# Patient Record
Sex: Female | Born: 1935 | Race: White | Hispanic: No | Smoking: Never smoker
Health system: Southern US, Community
[De-identification: ages and names within clinical notes are randomized; demographics above are authoritative.]

## PROBLEM LIST (undated history)

## (undated) DIAGNOSIS — F329 Major depressive disorder, single episode, unspecified: Secondary | ICD-10-CM

## (undated) DIAGNOSIS — F32A Depression, unspecified: Secondary | ICD-10-CM

## (undated) DIAGNOSIS — I4891 Unspecified atrial fibrillation: Secondary | ICD-10-CM

## (undated) DIAGNOSIS — F319 Bipolar disorder, unspecified: Secondary | ICD-10-CM

## (undated) DIAGNOSIS — I639 Cerebral infarction, unspecified: Secondary | ICD-10-CM

## (undated) DIAGNOSIS — I739 Peripheral vascular disease, unspecified: Secondary | ICD-10-CM

## (undated) DIAGNOSIS — K2270 Barrett's esophagus without dysplasia: Secondary | ICD-10-CM

## (undated) DIAGNOSIS — K219 Gastro-esophageal reflux disease without esophagitis: Secondary | ICD-10-CM

## (undated) DIAGNOSIS — I10 Essential (primary) hypertension: Secondary | ICD-10-CM

## (undated) DIAGNOSIS — F0390 Unspecified dementia without behavioral disturbance: Secondary | ICD-10-CM

## (undated) DIAGNOSIS — I509 Heart failure, unspecified: Secondary | ICD-10-CM

---

## 2004-08-04 ENCOUNTER — Emergency Department: Payer: Self-pay | Admitting: Emergency Medicine

## 2004-08-11 ENCOUNTER — Emergency Department: Payer: Self-pay | Admitting: Unknown Physician Specialty

## 2005-02-04 ENCOUNTER — Emergency Department: Payer: Self-pay | Admitting: Unknown Physician Specialty

## 2005-03-02 ENCOUNTER — Ambulatory Visit: Payer: Self-pay

## 2005-03-15 ENCOUNTER — Ambulatory Visit: Payer: Self-pay

## 2005-06-11 ENCOUNTER — Ambulatory Visit: Payer: Self-pay

## 2005-08-12 ENCOUNTER — Ambulatory Visit: Payer: Self-pay | Admitting: Family Medicine

## 2005-10-03 ENCOUNTER — Emergency Department: Payer: Self-pay | Admitting: Emergency Medicine

## 2005-11-16 ENCOUNTER — Ambulatory Visit: Payer: Self-pay | Admitting: Family Medicine

## 2005-11-18 ENCOUNTER — Ambulatory Visit: Payer: Self-pay | Admitting: General Surgery

## 2005-11-30 ENCOUNTER — Ambulatory Visit: Payer: Self-pay | Admitting: Family Medicine

## 2006-01-20 ENCOUNTER — Ambulatory Visit: Payer: Self-pay | Admitting: Family Medicine

## 2006-01-31 ENCOUNTER — Ambulatory Visit: Payer: Self-pay | Admitting: Family Medicine

## 2006-02-11 ENCOUNTER — Ambulatory Visit: Payer: Self-pay | Admitting: Family Medicine

## 2006-07-27 IMAGING — CR DG CHEST 2V
1 series · 2 of 2 positions shown · non-contrast
Comparison: none

REASON FOR EXAM: Cough,weight loss
COMMENTS:

[Series 4616: postero_anterior · 0.11mm/px · 2 of 2 slices shown]
[im 1/2]
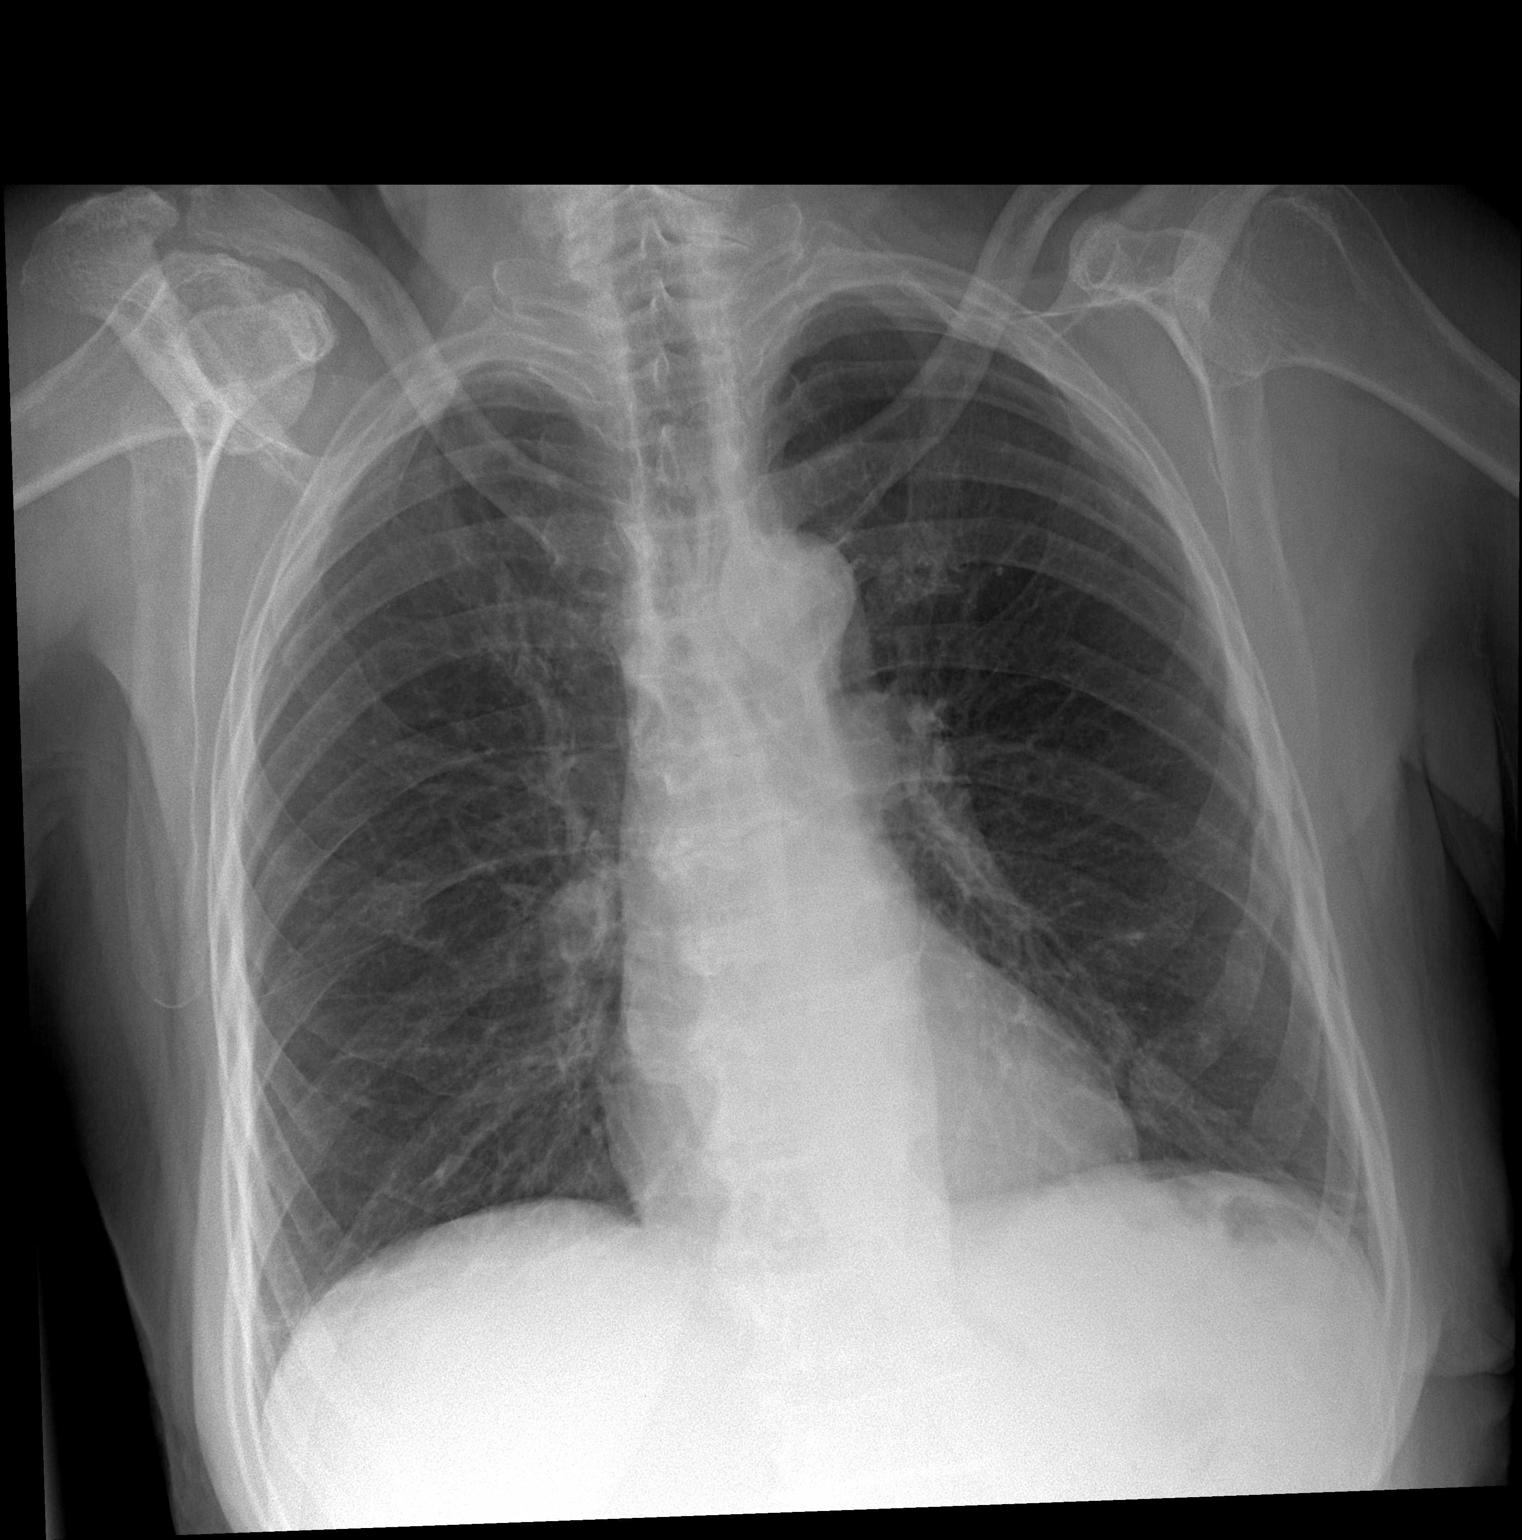
[im 2/2]
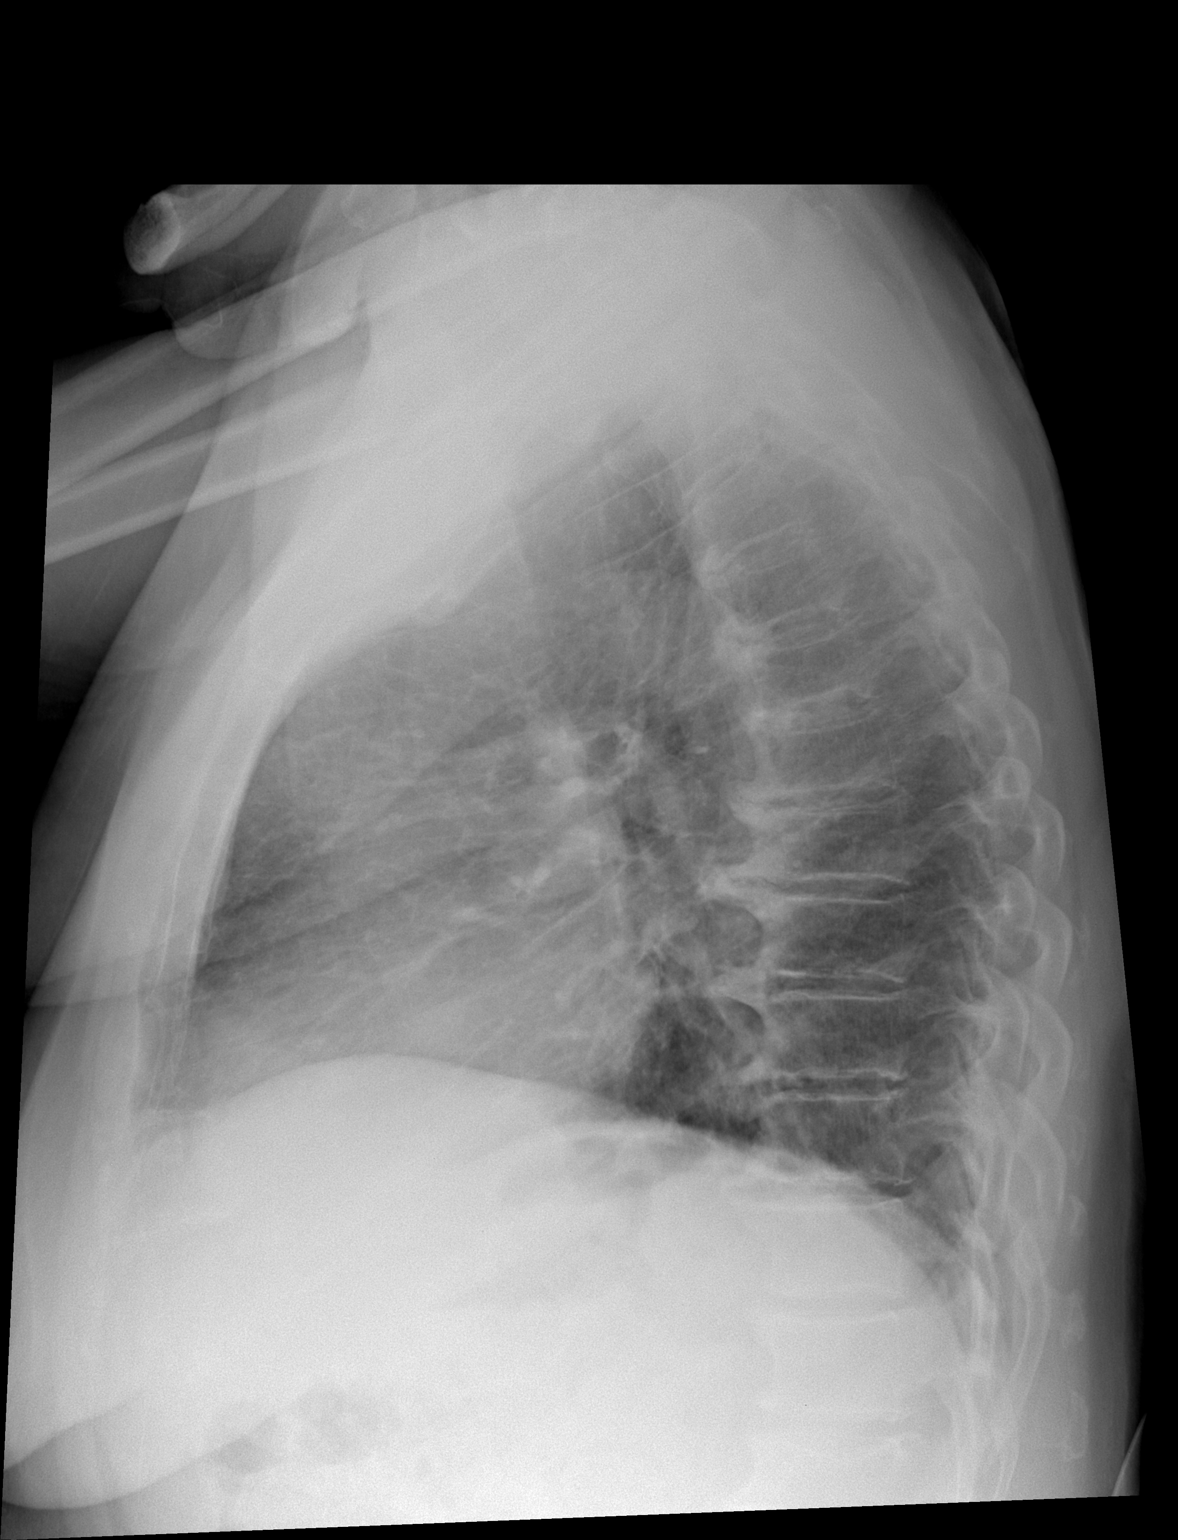

[2 of 2 positions shown; findings below may reference images not displayed]

PROCEDURE:     DXR - DXR CHEST PA (OR AP) AND LATERAL  - November 16, 2005  [DATE]

RESULT:        PA and lateral view were obtained and compared to prior study
of 02/05/05.  The heart is within normal limits in size.  The lung fields
appear clear. There is some linear subsegmental atelectasis in the lung
bases.  No effusions are noted.  Vascularity is within normal limits.

Degenerative spurs are noted in the thoracic spine as seen previously.
IMPRESSION: Subsegmental atelectasis in the lung bases.  The lung fields are otherwise
clear.

## 2006-10-22 IMAGING — CR DG CLAVICLE*R*
1 series · 4 of 4 positions shown · non-contrast
Comparison: none

REASON FOR EXAM: Fall
COMMENTS:

[Series 1: view not recorded · 0.17mm/px · 4 of 4 slices shown]
[im 1/4]
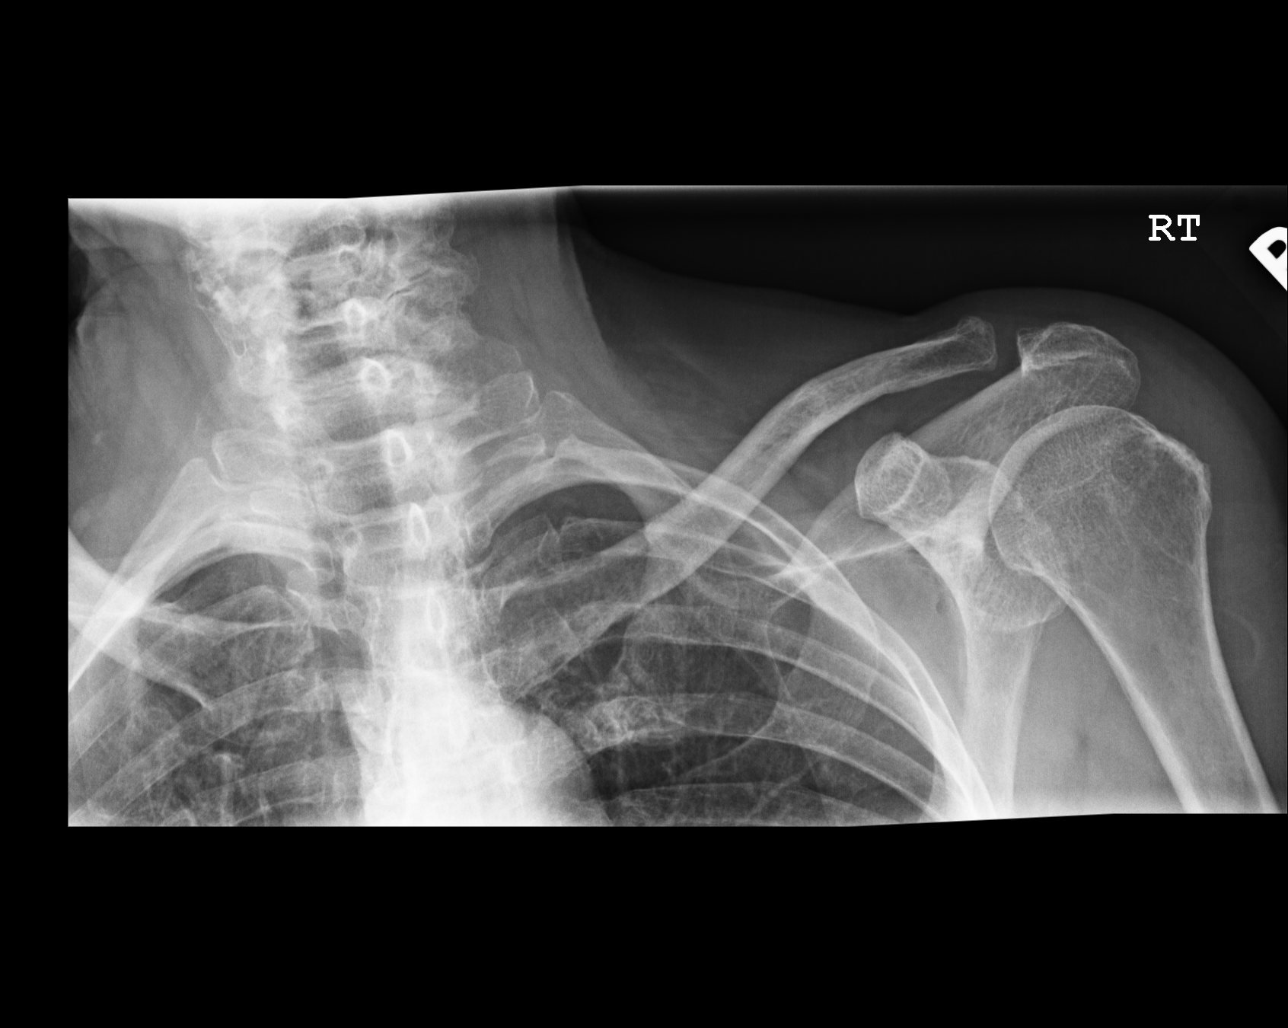
[im 2/4]
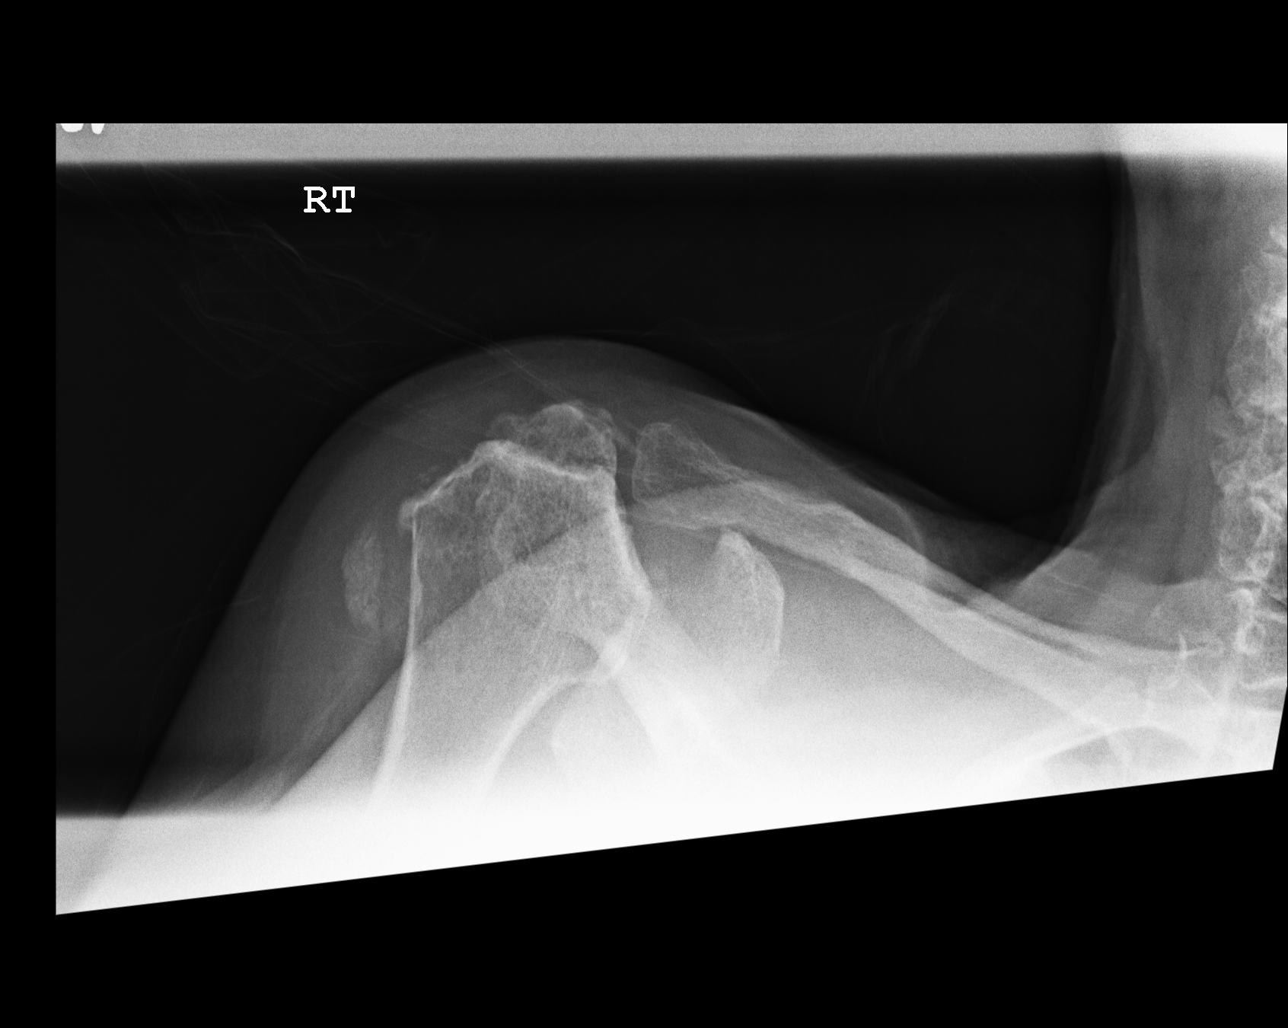
[im 3/4]
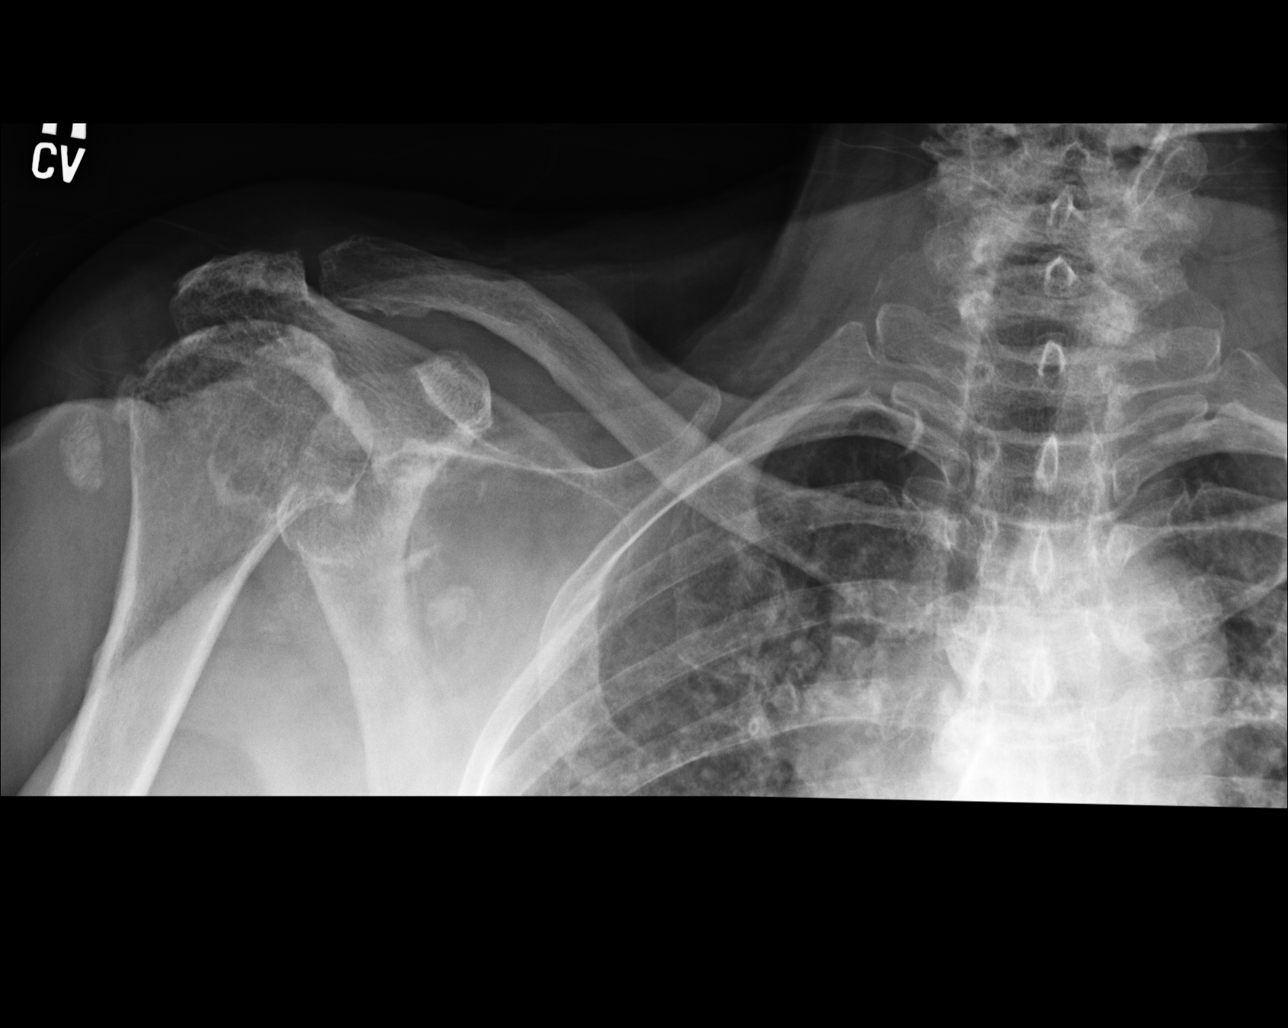
[im 4/4]
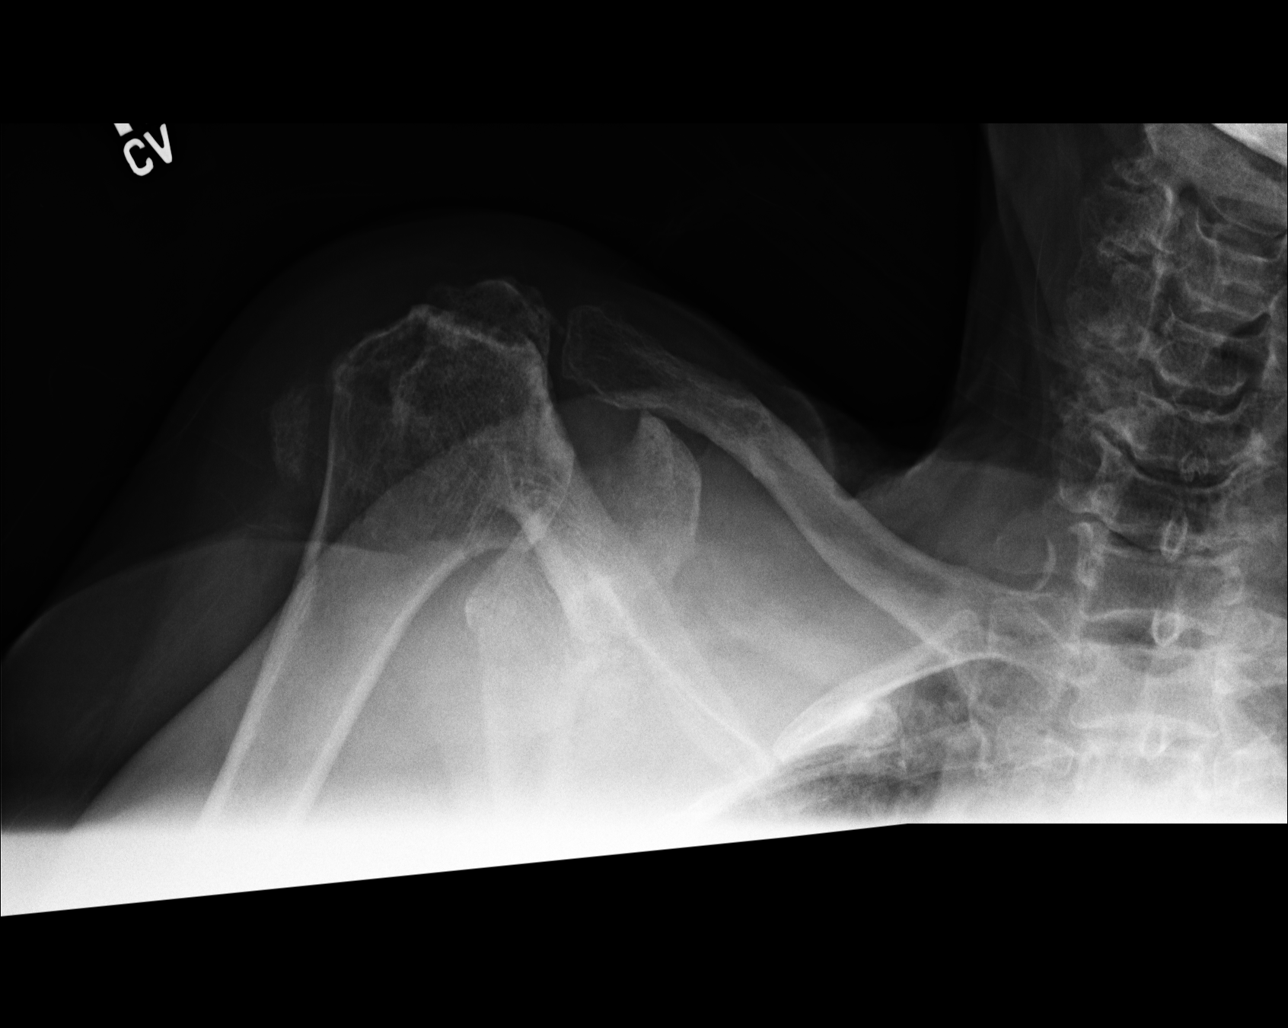

[4 of 4 positions shown; findings below may reference images not displayed]

PROCEDURE:     DXR - DXR CLAVICLE RIGHT  - February 11, 2006  [DATE]

RESULT:        The initial view of the study is of the LEFT clavicle and was
incorrectly marked.  This was included accidentally by the technologist.

The RIGHT humeral head is irregular and superiorly subluxed.  Has the
patient had previous trauma or hemorrhage in the joint space?  There is
evidence of subacromial impingement by plain film.  Some areas of
heterotopic calcification may be present about the scapula and proximal
humerus. An occult fracture could not be completely excluded but a discrete
fracture is not evident at this time.
IMPRESSION: See above.

## 2006-10-22 IMAGING — CR DG SHOULDER 3+V*R*
1 series · 5 of 5 positions shown · non-contrast
Comparison: none

REASON FOR EXAM: fall
COMMENTS:

[Series 1: view not recorded · 0.17mm/px · 5 of 5 slices shown]
[im 1/5]
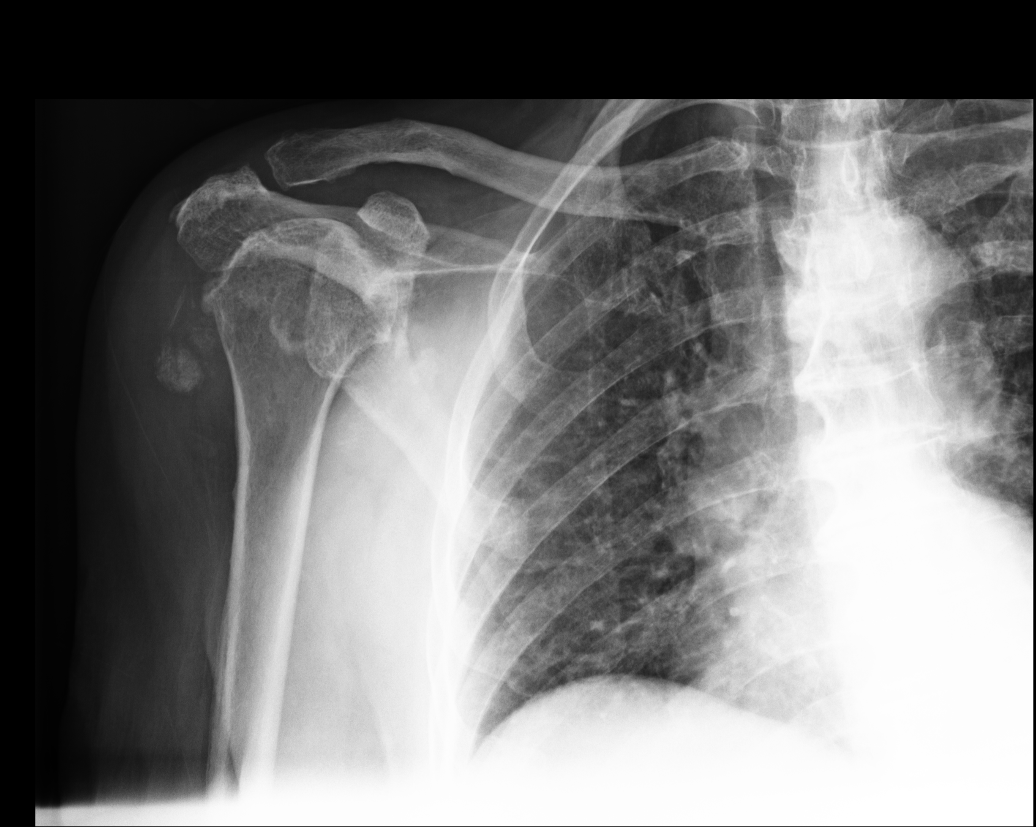
[im 2/5]
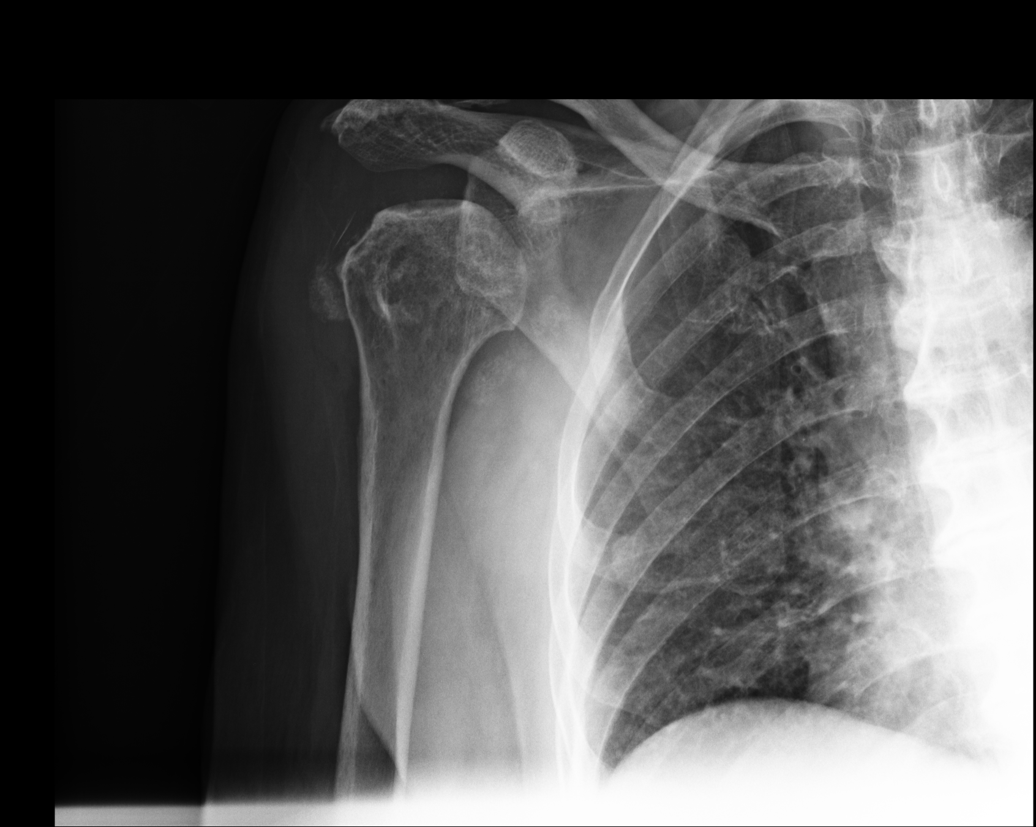
[im 3/5]
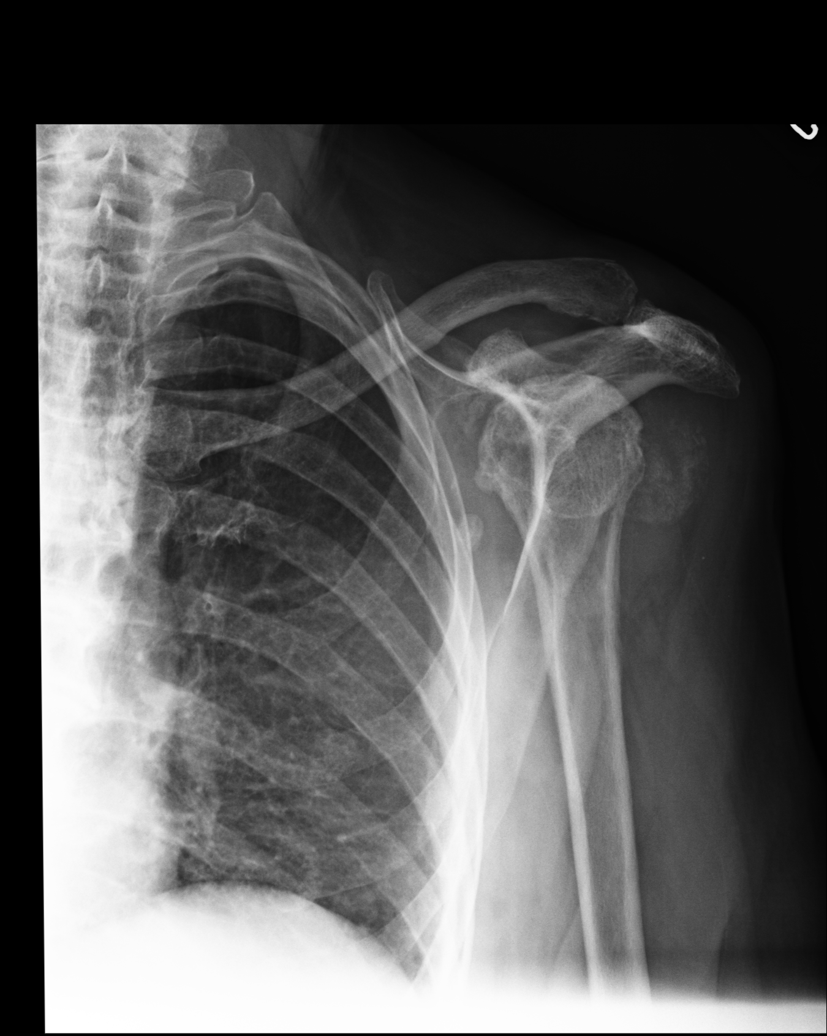
[im 4/5]
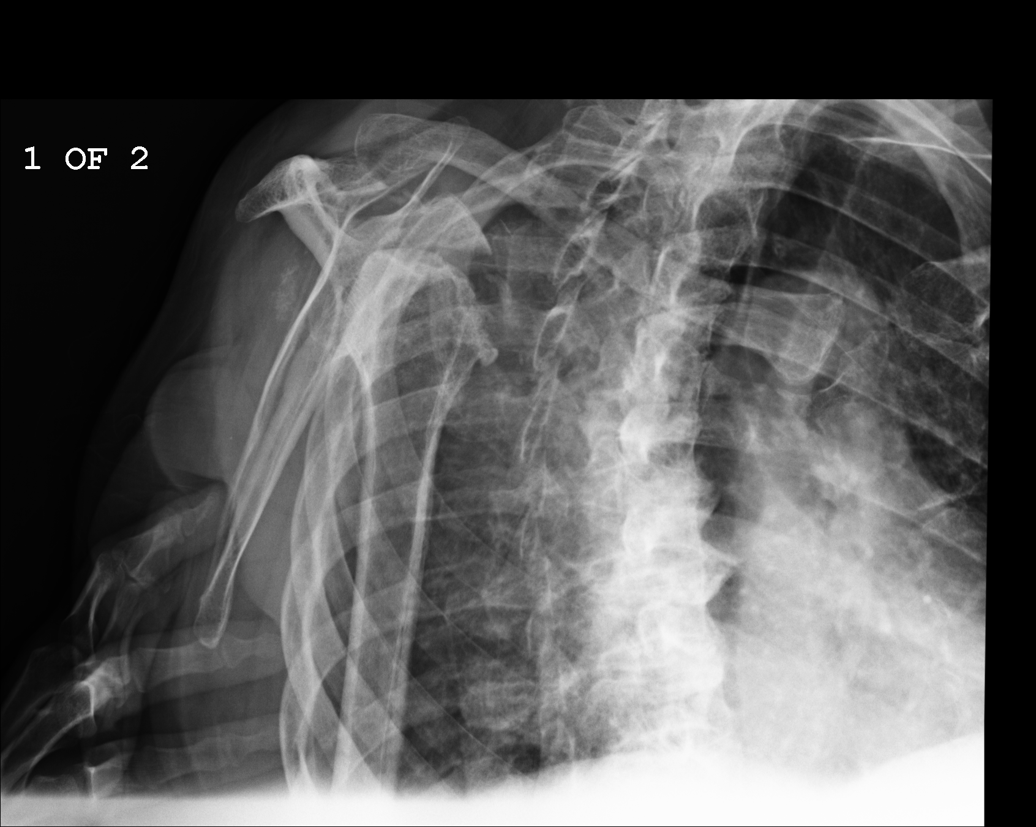
[im 5/5]
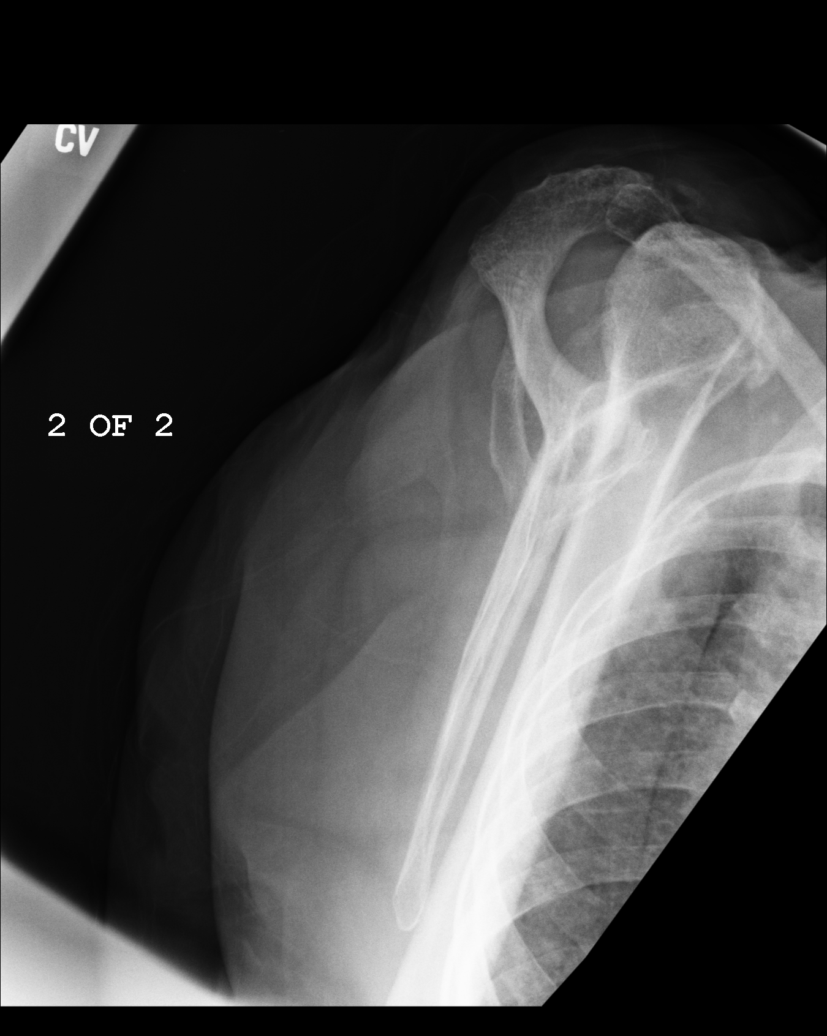

[5 of 5 positions shown; findings below may reference images not displayed]

PROCEDURE:     DXR - DXR SHOULDER RIGHT COMPLETE  - February 11, 2006  [DATE]

RESULT:          Views of the RIGHT shoulder and clavicle show chronic
changes about the humeral head which are seen to be unchanged since 2554.
The clavicle appears to be intact.  This humeral head is subluxed superiorly
with some impingement in the subacromial region likely.
IMPRESSION: Please see above.

## 2006-10-22 IMAGING — CR DG RIBS BILAT 3V
1 series · 5 of 5 positions shown · non-contrast
Comparison: none

REASON FOR EXAM: fall
COMMENTS:

PROCEDURE:     DXR - DXR RIBS BILATERAL  - February 11, 2006  [DATE]
RESULT:          Bilateral rib films show no definite fracture.  There is a
scoliotic curvature concave to the RIGHT in the mid lumbar spine.

[Series 1: view not recorded · 0.17mm/px · 5 of 5 slices shown]
[im 1/5]
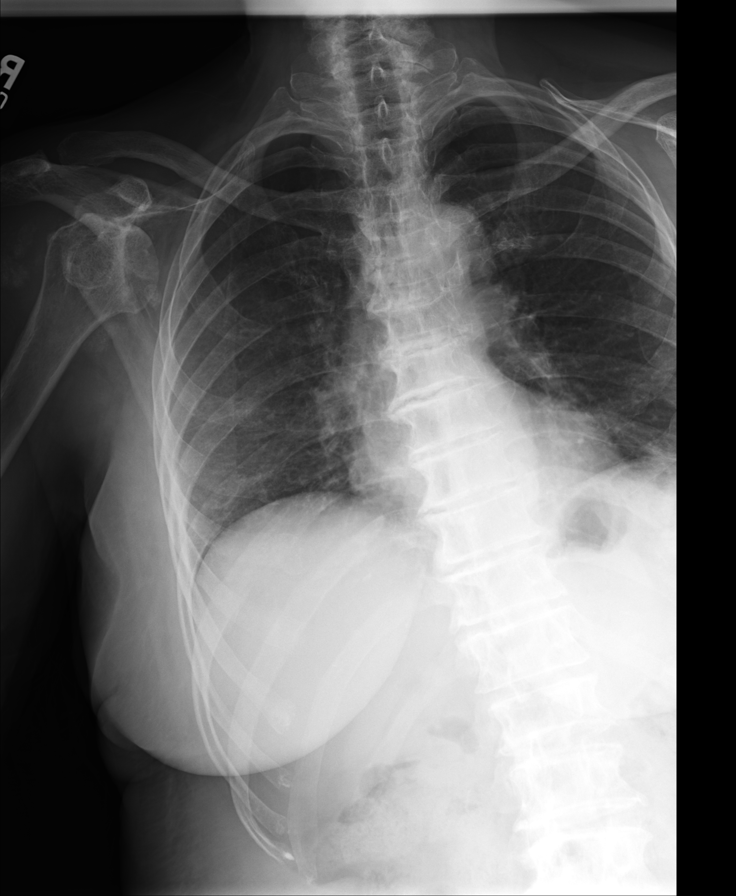
[im 2/5]
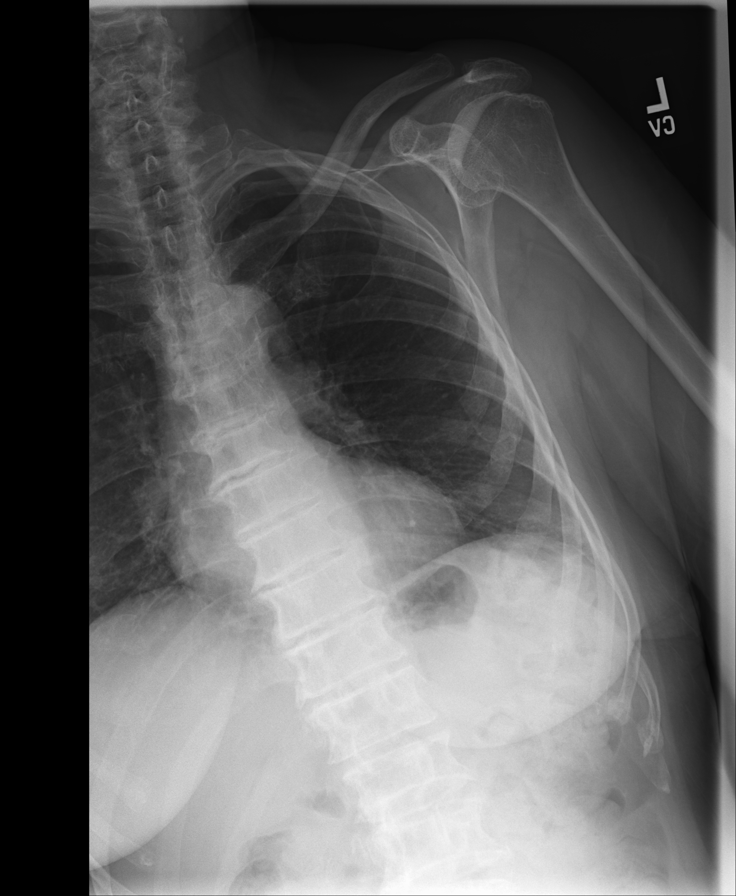
[im 3/5]
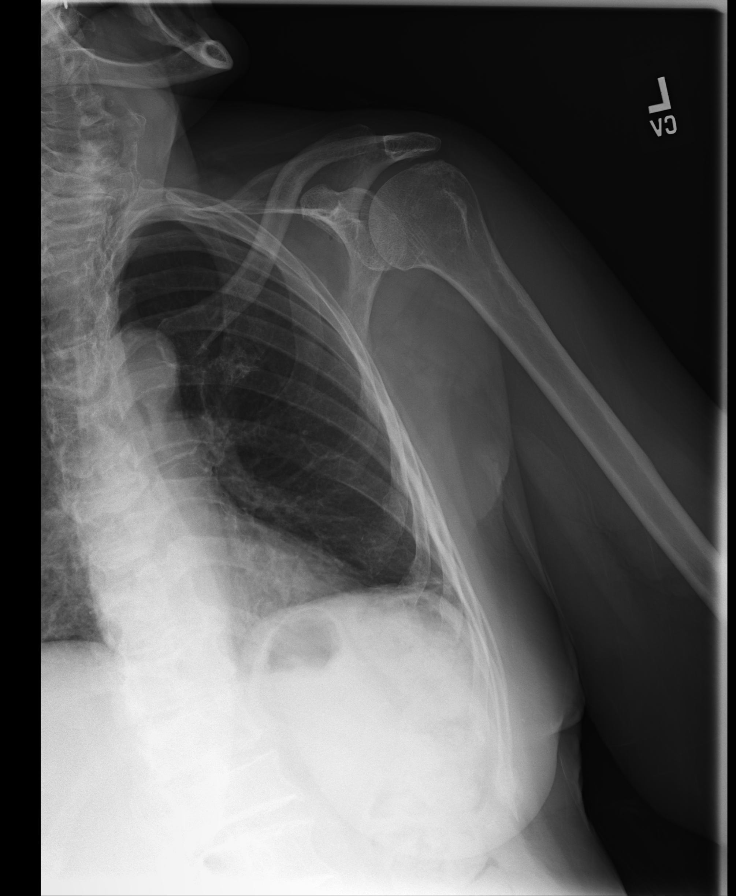
[im 4/5]
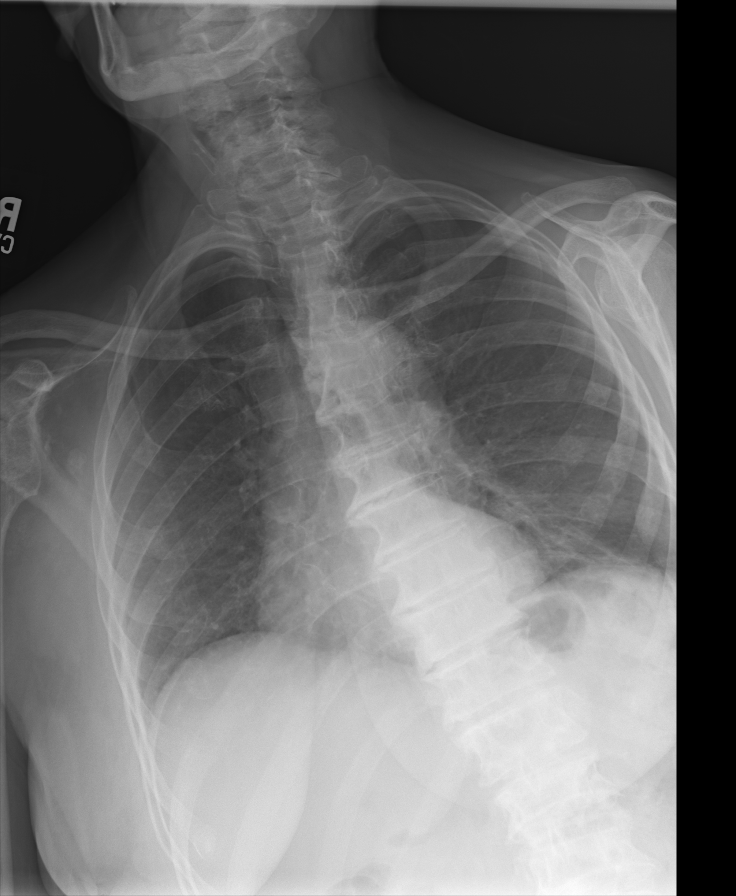
[im 5/5]
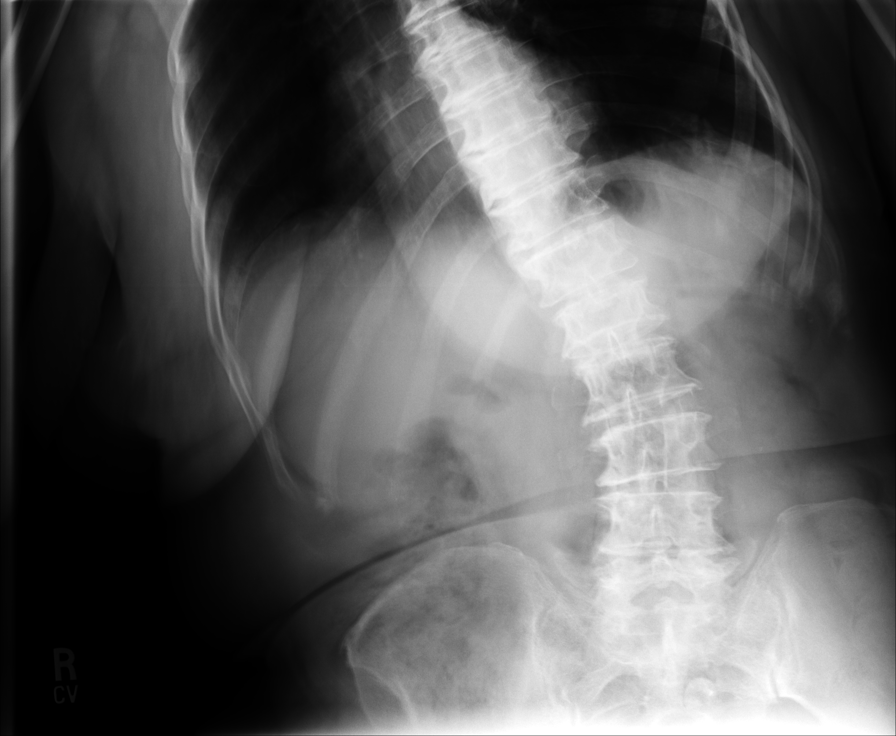

[5 of 5 positions shown; findings below may reference images not displayed]

IMPRESSION: No rib fractures.

## 2007-11-17 ENCOUNTER — Ambulatory Visit: Payer: Self-pay | Admitting: Family Medicine

## 2007-12-01 ENCOUNTER — Encounter: Payer: Self-pay | Admitting: Family Medicine

## 2007-12-15 ENCOUNTER — Encounter: Payer: Self-pay | Admitting: Family Medicine

## 2008-01-15 ENCOUNTER — Encounter: Payer: Self-pay | Admitting: Family Medicine

## 2008-02-02 ENCOUNTER — Ambulatory Visit: Payer: Self-pay | Admitting: Family Medicine

## 2008-02-14 ENCOUNTER — Ambulatory Visit: Payer: Self-pay | Admitting: Family Medicine

## 2008-03-29 ENCOUNTER — Ambulatory Visit: Payer: Self-pay | Admitting: Family Medicine

## 2008-04-11 ENCOUNTER — Encounter: Payer: Self-pay | Admitting: Family Medicine

## 2008-04-16 ENCOUNTER — Encounter: Payer: Self-pay | Admitting: Family Medicine

## 2008-05-08 ENCOUNTER — Ambulatory Visit: Payer: Self-pay | Admitting: Family Medicine

## 2008-10-02 ENCOUNTER — Emergency Department: Payer: Self-pay | Admitting: Emergency Medicine

## 2011-01-27 ENCOUNTER — Other Ambulatory Visit (HOSPITAL_COMMUNITY): Payer: Self-pay | Admitting: Family Medicine

## 2011-01-27 DIAGNOSIS — W19XXXA Unspecified fall, initial encounter: Secondary | ICD-10-CM

## 2011-01-27 DIAGNOSIS — T148XXA Other injury of unspecified body region, initial encounter: Secondary | ICD-10-CM

## 2011-01-27 DIAGNOSIS — R58 Hemorrhage, not elsewhere classified: Secondary | ICD-10-CM

## 2011-02-01 ENCOUNTER — Ambulatory Visit (HOSPITAL_COMMUNITY)
Admission: RE | Admit: 2011-02-01 | Discharge: 2011-02-01 | Disposition: A | Discharge status: Home / Self Care | Payer: Medicare Other | Source: Ambulatory Visit | Attending: Family Medicine | Admitting: Family Medicine

## 2011-02-01 DIAGNOSIS — T148XXA Other injury of unspecified body region, initial encounter: Secondary | ICD-10-CM

## 2011-02-01 DIAGNOSIS — R58 Hemorrhage, not elsewhere classified: Secondary | ICD-10-CM

## 2011-02-01 DIAGNOSIS — R51 Headache: Secondary | ICD-10-CM | POA: Insufficient documentation

## 2011-02-01 DIAGNOSIS — S1093XA Contusion of unspecified part of neck, initial encounter: Secondary | ICD-10-CM | POA: Insufficient documentation

## 2011-02-01 DIAGNOSIS — S0003XA Contusion of scalp, initial encounter: Secondary | ICD-10-CM | POA: Insufficient documentation

## 2011-02-01 DIAGNOSIS — W19XXXA Unspecified fall, initial encounter: Secondary | ICD-10-CM | POA: Insufficient documentation

## 2011-10-23 ENCOUNTER — Inpatient Hospital Stay: Payer: Self-pay | Admitting: Internal Medicine

## 2011-10-23 LAB — URINALYSIS, COMPLETE
Bilirubin,UR: NEGATIVE
Glucose,UR: NEGATIVE mg/dL (ref 0–75)
Ketone: NEGATIVE
Nitrite: NEGATIVE
Protein: NEGATIVE
RBC,UR: 159 /HPF (ref 0–5)
WBC UR: 8 /HPF (ref 0–5)

## 2011-10-23 LAB — CBC
HCT: 28.8 % — ABNORMAL LOW (ref 35.0–47.0)
HGB: 9.2 g/dL — ABNORMAL LOW (ref 12.0–16.0)
MCHC: 31.8 g/dL — ABNORMAL LOW (ref 32.0–36.0)
RBC: 3.97 10*6/uL (ref 3.80–5.20)
WBC: 6.6 10*3/uL (ref 3.6–11.0)

## 2011-10-23 LAB — COMPREHENSIVE METABOLIC PANEL
Alkaline Phosphatase: 103 U/L (ref 50–136)
Bilirubin,Total: 0.9 mg/dL (ref 0.2–1.0)
Calcium, Total: 8.7 mg/dL (ref 8.5–10.1)
Co2: 27 mmol/L (ref 21–32)
Creatinine: 0.94 mg/dL (ref 0.60–1.30)
EGFR (Non-African Amer.): >60
SGOT(AST): 33 U/L (ref 15–37)
SGPT (ALT): 31 U/L

## 2011-10-23 LAB — BASIC METABOLIC PANEL
Anion Gap: 13 (ref 7–16)
BUN: 19 mg/dL — ABNORMAL HIGH (ref 7–18)
Chloride: 93 mmol/L — ABNORMAL LOW (ref 98–107)
Co2: 28 mmol/L (ref 21–32)
Creatinine: 1.06 mg/dL (ref 0.60–1.30)
EGFR (African American): >60
Glucose: 88 mg/dL (ref 65–99)
Osmolality: 270 (ref 275–301)
Potassium: 3.2 mmol/L — ABNORMAL LOW (ref 3.5–5.1)

## 2011-10-23 LAB — SODIUM, URINE, RANDOM: Sodium, Urine Random: 29 mmol/L (ref 20–110)

## 2011-10-23 LAB — OSMOLALITY, URINE: Osmolality: 141 mOsm/kg

## 2011-10-23 LAB — CK TOTAL AND CKMB (NOT AT ARMC): CK, Total: 191 U/L (ref 21–215)

## 2011-10-23 LAB — TROPONIN I: Troponin-I: <0.02 ng/mL

## 2011-10-24 LAB — CBC WITH DIFFERENTIAL/PLATELET
Basophil %: 1.8 %
Eosinophil #: 0.1 10*3/uL (ref 0.0–0.7)
Eosinophil %: 1 %
HGB: 9.2 g/dL — ABNORMAL LOW (ref 12.0–16.0)
Lymphocyte %: 4.9 %
Monocyte %: 7.9 %
Neutrophil %: 84.4 %
RBC: 3.97 10*6/uL (ref 3.80–5.20)
WBC: 9.3 10*3/uL (ref 3.6–11.0)

## 2011-10-24 LAB — BASIC METABOLIC PANEL
Chloride: 90 mmol/L — ABNORMAL LOW (ref 98–107)
Co2: 32 mmol/L (ref 21–32)
Creatinine: 1.12 mg/dL (ref 0.60–1.30)
EGFR (African American): >60
Sodium: 132 mmol/L — ABNORMAL LOW (ref 136–145)

## 2011-10-24 LAB — FERRITIN: Ferritin (ARMC): 24 ng/mL (ref 8–388)

## 2011-10-29 LAB — CULTURE, BLOOD (SINGLE)

## 2012-05-31 ENCOUNTER — Ambulatory Visit: Payer: Self-pay | Admitting: Gastroenterology

## 2012-06-02 LAB — PATHOLOGY REPORT

## 2012-10-28 ENCOUNTER — Observation Stay: Payer: Self-pay | Admitting: Internal Medicine

## 2012-10-28 LAB — CBC WITH DIFFERENTIAL/PLATELET
Basophil %: 0.7 %
Eosinophil %: 0.2 %
HCT: 41.6 % (ref 35.0–47.0)
HGB: 13.9 g/dL (ref 12.0–16.0)
MCH: 30.1 pg (ref 26.0–34.0)
MCHC: 33.4 g/dL (ref 32.0–36.0)
Monocyte #: 0.4 x10 3/mm (ref 0.2–0.9)
Neutrophil #: 5.2 10*3/uL (ref 1.4–6.5)
Neutrophil %: 74.5 %
Platelet: 177 10*3/uL (ref 150–440)
RBC: 4.62 10*6/uL (ref 3.80–5.20)
RDW: 13.3 % (ref 11.5–14.5)

## 2012-10-28 LAB — URINALYSIS, COMPLETE
Bilirubin,UR: NEGATIVE
Glucose,UR: NEGATIVE mg/dL (ref 0–75)
Ph: 8 (ref 4.5–8.0)
Protein: NEGATIVE
RBC,UR: 3 /HPF (ref 0–5)
Specific Gravity: 1.01 (ref 1.003–1.030)
WBC UR: 42 /HPF (ref 0–5)

## 2012-10-28 LAB — COMPREHENSIVE METABOLIC PANEL
Alkaline Phosphatase: 149 U/L — ABNORMAL HIGH (ref 50–136)
Anion Gap: 2 — ABNORMAL LOW (ref 7–16)
Bilirubin,Total: 0.4 mg/dL (ref 0.2–1.0)
Calcium, Total: 8.8 mg/dL (ref 8.5–10.1)
Chloride: 99 mmol/L (ref 98–107)
EGFR (African American): >60
Potassium: 4.4 mmol/L (ref 3.5–5.1)
SGOT(AST): 39 U/L — ABNORMAL HIGH (ref 15–37)
Total Protein: 7.6 g/dL (ref 6.4–8.2)

## 2012-10-28 LAB — HEMOGLOBIN
HGB: 13.2 g/dL (ref 12.0–16.0)
HGB: 13 g/dL (ref 12.0–16.0)

## 2012-10-28 LAB — PROTIME-INR
INR: 1
Prothrombin Time: 13 secs (ref 11.5–14.7)

## 2012-10-29 LAB — CBC WITH DIFFERENTIAL/PLATELET
Basophil #: 0 10*3/uL (ref 0.0–0.1)
Eosinophil %: 4 %
HGB: 12.7 g/dL (ref 12.0–16.0)
Lymphocyte #: 1.5 10*3/uL (ref 1.0–3.6)
Neutrophil %: 53.7 %
RBC: 4.24 10*6/uL (ref 3.80–5.20)
RDW: 13.2 % (ref 11.5–14.5)

## 2012-10-29 LAB — BASIC METABOLIC PANEL
BUN: 15 mg/dL (ref 7–18)
Calcium, Total: 8.2 mg/dL — ABNORMAL LOW (ref 8.5–10.1)
Chloride: 102 mmol/L (ref 98–107)
Co2: 27 mmol/L (ref 21–32)
EGFR (Non-African Amer.): 58 — ABNORMAL LOW
Potassium: 4.1 mmol/L (ref 3.5–5.1)

## 2012-11-13 ENCOUNTER — Encounter: Payer: Self-pay | Admitting: General Surgery

## 2014-03-16 ENCOUNTER — Emergency Department: Payer: Self-pay | Admitting: Emergency Medicine

## 2014-03-16 LAB — URINALYSIS, COMPLETE
Bilirubin,UR: NEGATIVE
Glucose,UR: NEGATIVE mg/dL (ref 0–75)
KETONE: NEGATIVE
NITRITE: POSITIVE
Ph: 6 (ref 4.5–8.0)
Protein: NEGATIVE
RBC,UR: NONE SEEN /HPF (ref 0–5)
Specific Gravity: 1.003 (ref 1.003–1.030)
WBC UR: 9 /HPF (ref 0–5)

## 2014-03-16 LAB — COMPREHENSIVE METABOLIC PANEL
ALT: 29 U/L
Albumin: 3.3 g/dL — ABNORMAL LOW (ref 3.4–5.0)
Alkaline Phosphatase: 137 U/L — ABNORMAL HIGH
Anion Gap: 4 — ABNORMAL LOW (ref 7–16)
BILIRUBIN TOTAL: 0.5 mg/dL (ref 0.2–1.0)
BUN: 12 mg/dL (ref 7–18)
CHLORIDE: 102 mmol/L (ref 98–107)
Calcium, Total: 9 mg/dL (ref 8.5–10.1)
Co2: 30 mmol/L (ref 21–32)
Creatinine: 1 mg/dL (ref 0.60–1.30)
EGFR (African American): >60
GFR CALC NON AF AMER: 54 — AB
Glucose: 114 mg/dL — ABNORMAL HIGH (ref 65–99)
Osmolality: 273 (ref 275–301)
POTASSIUM: 4.4 mmol/L (ref 3.5–5.1)
SGOT(AST): 40 U/L — ABNORMAL HIGH (ref 15–37)
Sodium: 136 mmol/L (ref 136–145)
Total Protein: 7.8 g/dL (ref 6.4–8.2)

## 2014-03-16 LAB — CBC
HCT: 41.6 % (ref 35.0–47.0)
HGB: 13.3 g/dL (ref 12.0–16.0)
MCH: 26.4 pg (ref 26.0–34.0)
MCHC: 31.9 g/dL — ABNORMAL LOW (ref 32.0–36.0)
MCV: 83 fL (ref 80–100)
Platelet: 157 10*3/uL (ref 150–440)
RBC: 5.03 10*6/uL (ref 3.80–5.20)
RDW: 20.4 % — ABNORMAL HIGH (ref 11.5–14.5)
WBC: 7 10*3/uL (ref 3.6–11.0)

## 2014-03-16 LAB — TROPONIN I

## 2014-12-06 NOTE — H&P (Signed)
DATE OF BIRTH:  1932-12-31  DATE OF ADMISSION:  10/28/2012  REFERRING PHYSICIAN:   PRIMARY CARE PHYSICIAN:  Dr. Brynda Greathouse  CHIEF COMPLAINT:  Coffee-ground emesis.   HISTORY OF PRESENT ILLNESS:  This is a 81 year old female who is a nursing home resident with a history of mental retardation, who is a ward of the state. She is a resident at [facility], was sent to ED due to coffee-ground emesis. As per nursing home, patient had 3 episodes of coffee-ground emesis, for which she was given IM Phenergan, and she did not vomit any more. When patient presented to ED, she had some dark-colored, dried vomitus material around her mouth area, but since patient in ED did not have any further episodes of vomiting. The patient was Hemoccult-negative in the ED. There is no documentation of any bright red blood per rectum or melena or hematochezia. By reviewing patient's record, it appears she recently had EGD and colonoscopy for iron deficiency anemia. EGD and colonoscopy done by Dr. Candace Cruise in October of last year, when she was found to have Barrett's esophagus. She had gastroesophageal reflux disease, with biopsy done showing intestinal metaplasia, and diverticulosis on the colonoscopy, but had no evidence of active GI bleed. The patient is on aspirin and meloxicam at the nursing home, and there are no recent changes in her medication regimen. The patient has mental retardation, is an extremely poor historian. No additional useful information could be obtained from the patient. The patient's vital signs were stable, and her hemoglobin was 13.9, which is much improvement than her baseline, which is around 9 as of March 2013. The patient's INR was 1. The patient had urinalysis done, which did show significant evidence of urinary tract infection, with 242 white blood cells, +3 leukocyte esterase and +1 bacteria. The patient denies any urinary complaints but, as mentioned earlier, cannot rely on her review of systems.    PAST MEDICAL HISTORY: 1.  Mental retardation, patient is a ward of the state.  2.  Bipolar disorder.  3.  History of polydipsia.  4.  Hypertension.  5.  Osteoporosis.  6.  Gastroesophageal reflux disease.  7.  Questionable history of seizure disorder.  8.  History of atrial fibrillation in 2002. 9.  History of  Depakote toxicity with catatonia.   10.  Osteoarthritis.  11.  Urinary retention.  12.  Anxiety.  13.  History of sleep apnea.  14.  History of GI bleed due to diverticulosis in the past.   PAST SURGICAL HISTORY: As per old records, tonsillectomy, adenoidectomy, knee and foot surgery.   HOME MEDICATIONS: 1.  Vitamin D3 - 50,000 units once a month.  2.  Colace 100 mg oral daily.  3.  Cymbalta 60 mg oral daily.  4.  Fluticasone 2 sprays at bedtime.  5.  Potassium chloride 20 mEq oral daily.  6.  Lasix 40 mg oral daily.  7.  Lipitor 20 mg oral at bedtime.  8.  Metoprolol extended release 25 mg 2 times a day.  9.  Namenda 10 mg oral 2 times a day.  10.  Neurontin 100 mg oral 3 times a day.  11.  Olanzapine 10 mg oral at bedtime.  12.  Omeprazole 20 mg oral daily.  13.  Tylenol as needed.  14.  DuoNeb every 4 hours as needed.  15.  Lorazepam 0.5 mg every 8 hours as needed.  16.  MiraLAX daily as needed.  17.  Ultram 50 mg every 6 hours as needed.  18.  Zofran 4 mg every 6 hours as needed.   SOCIAL HISTORY:  The patient is a resident of [facility]. She is ward of the state. There is no history of smoking, alcohol or drug use.   FAMILY HISTORY:  The patient is unable to provide any family history.   REVIEW OF SYSTEMS: As mentioned, patient has mental retardation and cannot give reliable review of systems, as she actually denied having any episodes of vomiting, while it is clear she had 3 episodes of coffee-ground emesis.  GENERAL:  Denies any fever, chills, fatigue, weakness.  EYES:  Denies blurry vision, double vision, pain or inflammation.  EARS, NOSE,  THROAT:  Denies tinnitus, ear pain, hearing loss, epistaxis or discharge.  RESPIRATORY:  Denies cough, wheezing, hemoptysis, dyspnea.  CARDIOVASCULAR:  Denies chest pain, arrhythmia, edema, palpitations.  GASTROINTESTINAL: Denies nausea, vomiting, diarrhea, abdominal pain, hematemesis, melena, rectal bleed, constipation or diarrhea.  GENITOURINARY:  Denies dysuria, hematuria, renal colic.  ENDOCRINOLOGIC:  Denies polyuria, polydipsia, heat or cold intolerance.  HEMATOLOGIC:  Denies any easy bruising, bleeding, diathesis.  INTEGUMENTARY:  Denies acne, rash or skin lesions.  MUSCULOSKELETAL:  Complains of mild neck pain. Denies any shoulder pain, knee pain, cramps or gout.  NEUROLOGIC:  Denies dysarthria, epilepsy, vertigo, ataxia, headache, migraine.  PSYCHIATRIC:  Denies anxiety, insomnia, nervousness, bipolar disorder, substance or alcohol abuse.   PHYSICAL EXAMINATION: VITAL SIGNS:  Temperature 98, pulse 68, respiratory rate 18, blood pressure 127/60, saturating 96% on room air.  GENERAL: Elderly, well-nourished female who looks comfortable in bed in no apparent distress.  HEENT:  Head atraumatic, normocephalic. Pupils equal, reactive to light. Pink conjunctivae. Anicteric sclerae. Moist oral mucosa. By the time of my examination, her mouth has been washed, and could not appreciate any residual dark material.  NECK:  Supple. No thyromegaly. No JVD.  LYMPHATIC:  No cervical lymphadenopathy.  CHEST:  Good air entry bilaterally. No wheezing, rales, rhonchi.  CARDIOVASCULAR:  S1, S2 heard. No rubs, murmurs, gallops.  ABDOMEN:  Soft, nontender, nondistended. Bowel sounds present.  EXTREMITIES:  No edema. No clubbing. No cyanosis.  NEUROLOGIC:  Cranial nerves grossly intact. Nonfocal neurologic examination. Moves all extremities without focal deficits.  PSYCHIATRIC: The patient is pleasant, has normal mood and affect, answers appropriately only to name.   PERTINENT LABS: Glucose 133, BUN 29,  creatinine 0.91, sodium 135, potassium 4.4, chloride 99, CO2 of 34. Alk phos 149, AST 39, ALT 32. White blood cells 6.9, hemoglobin 13.9, hematocrit 41.6, platelets 177. Urinalysis showing 42 white blood cells, +3 leukocyte esterase, and +1 bacteria in the urine.   ASSESSMENT AND PLAN: 1.  Coffee-ground emesis. The patient had 3 witnessed episodes of coffee-ground emesis in the Emergency Department. She is Hemoccult negative in the ED. Her hemoglobin appears to be stable and actually much better than baseline at 13.9. She had recent EGD, which shows evidence of Barrett's esophagus and diverticulosis. On my rectal exam, patient was Hemoccult negative, did not appear to have any blood in her stools, as well had no melena. If there is a gastrointestinal bleed, it is most likely upper GI bleed. Patient is on aspirin and meloxicam. Will hold these meds. The patient was already given IV Protonix and started on Protonix drip. Will admit the patient. Will continue to monitor her hemoglobin every 6 hours, will keep her n.p.o. and will consult GI service as well. The patient will be kept n.p.o. except meds, and will leave the decision if any further studies need to  be done up to GI.   2.  Urinary tract infection. Will start patient on Rocephin. Will follow on the urine culture and adjust antibiotic if needed.   3.  Hypertension. Blood pressure is acceptable. Will continue patient on metoprolol once she is more stable. Meanwhile, will hold her metoprolol.   4.  Hyperlipidemia. We will continue patient on statin.   5. Psychiatric: Patient has history of bipolar disorder , Will continue her on Celexa and olanzapine.   6. Deep vein thrombosis prophylaxis. Given patient's coffee-ground emesis, will have her on sequential compression device.   7.  Gastrointestinal prophylaxis. Patient is on Protonix drip.   8.  CODE STATUS. As per nursing home documentation, patient is FULL CODE. She is a ward of the state.    Total time spent on patient and admission care:  55 minutes.      ____________________________ Albertine Patricia, MD dse:mr D: 10/28/2012 07:41:00 ET T: 10/28/2012 14:31:12 ET JOB#: [id number]  cc: Albertine Patricia, MD, <Dictator> Tanmay Halteman Graciela Husbands MD ELECTRONICALLY SIGNED 10/29/2012 4:16

## 2014-12-06 NOTE — Discharge Summary (Signed)
PATIENT NAME:  Kelly Cooley, CHIRICO MR#:  [id number] DATE OF BIRTH:  08-May-1933  DATE OF ADMISSION:  10/28/2012 DATE OF DISCHARGE:  10/30/2012  DISCHARGE DIAGNOSES: 1.  Hematemesis, resolved. 2.  Urinary tract infection.  3.  Barrett's esophagus.  4.  Bipolar disorder.  5.  Hypertension.  6.  Mental retardation.   PATIENT'S DISCHARGE MEDICATIONS: Olanzapine 1 tablet p.o. daily, metoprolol 25 mg 2 times daily, Chloraseptic spray 4 times daily as needed for sore throat, Flonase 2 sprays once a day in the nostrils for allergic rhinitis, Ativan 0.5 mg every 8 hours as needed for anxiety, MiraLAX and Colace as needed for constipation, Cymbalta 60 mg p.o. daily, potassium chloride 20 mEq p.o. daily, Neurontin 100 mg p.o. t.i.d., DuoNeb nebulizer as needed for trouble breathing every 6 hours, Ultram 50 mg every 6 hours as needed for pain, Protonix 40 mg p.o. b.i.d., Zofran 4 mg every 6 hours as needed for nausea and vomiting. She is on Cipro 500 mg p.o. b.i.d. for 10 days for UTI.   The patient will be on a pureed diet for 2 days, then advanced to a low-sodium diet after 2 days.   CONSULTATIONS: GI consult: Dr. Mechele Collin.   HOSPITAL COURSE:  A 81 year old female patient who is from [facility], came in because of 3 episodes of coffee-ground vomiting. The patient was brought in the Emergency Room because of that. The patient was admitted to the hospitalist service, started on IV Protonix drip. The patient previously  had > EGD and colonoscopy was done last year, and EGD did show some Barrett's esophagus, which was done last October. The patient also had GERD. Colonoscopy at that time showed no evidence of active GI bleed. The patient was on aspirin, meloxicam at the nursing home before.   The patient's hemoglobin was 13.9 on admission. The patient did not have any further episodes of hematemesis after starting Protonix drip. Did not have any black stools. Dr. Mechele Collin saw the patient and recommended  continuing the Protonix drip; and as she did not have any further hematemesis, started her on clear liquids. The patient's hemoglobin did stay stable without any further drop. Admission hemoglobin was 13.9; the hemoglobin stayed around 12.7 yesterday, hematocrit 38.3. The patient will be going with Protonix 40 mg p.o. b.i.d. and will probably needed a repeat EGD because October EGD showed Barrett's esophagus with intestinal metaplasia, so she will see Dr. Mechele Collin in the office, or Dr. Bluford Kaufmann, probably in 3 to 4 weeks for followup. The patient also had a CT of the abdomen because of possible abdominal pain. The patient's abdominal CAT scan did not show any acute changes. She is a poor historian, but she looked fine and tolerated the clear liquids. Did not have any further episodes of hematemesis.   UTI: The patient's urine looked like 3+ leukocyte esterase, WBC 42 at admission. Started on Rocephin IV. Urine culture showed gram-negative rods. Final culture pending, but she will be going with Cipro 500 mg b.i.d. for 10 days; and the patient's blood pressure medication, metoprolol and Lasix, were stopped. Blood pressure has been a little bit low yesterday, and today it dropped to 100/60, and the patient's heart rate is around 90. She can resume her metoprolol and Lasix again; she can restart it probably in 1 to 2 days She can continue her previous medications that she takes for her bipolar disorder and anxiety.   Time spent on discharge preparation took more than 30 minutes. She will be going  to [facility].     ____________________________ Katha HammingSnehalatha Yanis Larin, MD sk:dm D: 10/30/2012 10:29:00 ET T: 10/30/2012 11:32:30 ET JOB#: [id number]  cc: Katha HammingSnehalatha Sharena Dibenedetto, MD, <Dictator> Katha HammingSNEHALATHA Jermar Colter MD ELECTRONICALLY SIGNED 11/14/2012 21:36

## 2014-12-06 NOTE — Consult Note (Signed)
DATE OF BIRTH:  06-May-1933  DATE OF CONSULTATION:  10/28/2012  CONSULTING PHYSICIAN:  Scot Junobert T. Elliott, MD  REPORT OF CONSULTATION:  The patient is a 81 year old white female, mentally retarded, who is a ward of the state, is a resident at [facility]. She reportedly had 3 episodes of coffee-ground emesis and was sent to the ER. She was noted to have some dark-colored vomitus on her mouth area, and therefore she was admitted to the hospital for GI bleeding.   Since admission to the hospital, she has not had any further episodes of hematemesis, nor has she had any rectal bleeding.   Previous endoscopy and colonoscopy for iron deficiency anemia were done by Dr. Bluford Kaufmannh in October, and she was found to have Barrett's esophagus with intestinal metaplasia and diverticulosis on colonoscopy.    Supposedly, the patient was on meloxicam and aspirin at the nursing home. These have been discontinued.   Her baseline hemoglobin a year ago was 9, and her most recent hemoglobin was 13.9.  PAST MEDICAL HISTORY:   1.  Mental retardation.  2.  Bipolar disorder.  3.  Polydipsia.  4.  Hypertension.  5.  Osteoporosis. 6.  Barrett's esophagus. 7.  Previous atrial fibrillation 2002.  8.  Osteoarthritis.  9.  Urinary retention. 10.  Sleep apnea.  11.  Previous GI bleed from diverticulosis.  PAST SURGICAL HISTORY:  Tonsillectomy, adenoidectomy, knee and foot surgery.   HOME MEDICATIONS: Vitamin D3 - 50,000 units once a month. Colace 100 mg a day. Cymbalta 60 mg a day. Fluticasone 2 sprays at bedtime. Potassium chloride 20 mEq a day. Lasix 40 mg a day. Lipitor 20 mg a day. Metoprolol 25 mg 2 times a day. Namenda 10 mg twice a day. Neurontin 100 mg 3 times a day. Olanzapine 10 mg at bedtime. Omeprazole 20 mg a day. Tylenol p.r.n. DuoNeb q. 4 p.r.n. Lorazepam 0.5 mg q. 8 hours p.r.n. MiraLAX p.r.n. Ultram 50 mg q. 6 hours p.r.n. Zofran 4 mg q. 6 hours p.r.n.   HABITS:  No history of smoking or alcohol  use.   REVIEW OF SYSTEMS:  The patient is a very difficult historian, cannot reliably give a review of systems.   PHYSICAL EXAMINATION: GENERAL: Elderly white female in no acute distress, answering simple questions but having trouble following commands.  VITAL SIGNS:  Temp 97.3, pulse 74, blood pressure 177/80.  HEENT:  Sclerae nonicteric, conjunctivae negative. Tongue is negative.  HEAD:  Atraumatic. Trachea is in the midline.  CHEST:  Clear anterior fields.  BREASTS:  Exam not done. HEART:  Shows a 1/6 systolic murmur.  ABDOMEN: Bowel sounds present. No hepatosplenomegaly. No masses. No bruits. No significant tenderness.  EXTREMITIES:  Lower extremities have compression devices on.   LAB DATA:  Shows hemoglobin 13.9 at 4:00 a.m. At 12:30, 8 hours later, it was 13.2. Platelet count 177, white count 6.9. BUN 29, creatinine 0.9.   ASSESSMENT: Upper gastrointestinal bleed, with previous endoscopy 5 months ago not showing any ulcers or neoplasms. She likely has either bled from gastritis, possibly from meloxicam or aspirin, or ulcer disease. Since there has been no further bleeding since admission, I think we could start her on some clear liquids and advance slowly from there. Continue proton pump inhibitor twice a day, and p.r.n. nausea medication. Will follow with you.      ____________________________ Scot Junobert T. Elliott, MD rte:mr D: 10/28/2012 15:57:03 ET T: 10/28/2012 16:33:42 ET JOB#: [id number]  cc: Scot Junobert T. Elliott, MD, <Dictator> Serita ShellerErnest B.  Maryellen Pile, MD  Scot Jun MD ELECTRONICALLY SIGNED 11/08/2012 15:00

## 2014-12-06 NOTE — Consult Note (Signed)
CC: hematemesis,  her hgb has been stable, BUN and creat normal.  VSS,   Since no further vomiting and previous EGD last year I will hold EGD and start her on liquids and advance slowly.  If further vomiting or evidence of bleeding will do EGD, otherwise may return to nursing home Tuesday  Electronic Signatures: Scot JunElliott, Roxine Whittinghill T (MD)  (Signed on 16-Mar-14 14:24)  Authored  Last Updated: 16-Mar-14 14:24 by Scot JunElliott, Shawneequa Baldridge T (MD)

## 2014-12-08 NOTE — Discharge Summary (Signed)
PATIENT NAME:  Kelly Cooley, Kelly Cooley DATE OF BIRTH:  Oct 06, 1932  DATE OF ADMISSION:  10/23/2011 DATE OF DISCHARGE:  10/25/2011   PRESENTING COMPLAINT: Confusion and fall.   DISCHARGE DIAGNOSES:  1. Hyponatremia, suspected from mild dehydration, resolved.  2. Upper respiratory infection/congestion with urinary tract infection.  3. Microcytic anemia with no active bleeding.  4. Chronic compression deformity T5 and L1 with history of osteoporosis.  5. Hypertension.  6. Gastroesophageal reflux disease.  7. History of bipolar.  8. History of mental retardation and anxiety.   CONDITION ON DISCHARGE: Fair. Vitals stable. Sats are 94 to 97% on room air.   MEDICATIONS:  1. Os-Cal plus vitamin D 1 tablet b.i.d.  2. Omeprazole 20 mg b.i.d.  3. Robitussin-DM 10 mL p.o. q. 6 p.r.n. for cough for 10 days.  4. Menthols/benzocaine lozenge, one q. 2 p.r.n. for 10 days.  5. Alendronate 70 mg q. weekly.  6. Ativan 0.5 mg q. 8 hours p.r.n.  7. Meloxicam 15 mg daily.  8. Toprol-XL 25 mg b.i.d.  9. MiraLAX 17 grams orally daily p.r.n. for constipation.  10. Memantine 10 mg b.i.d.  11. Zyprexa 5 mg at bedtime.  12. Aspirin 81 mg daily.  13. Tylenol 650 mg p.o. every four hours p.r.n.  14. Flonase two sprays both nostrils daily.  15. Zofran 4 mg q. 6 hours p.r.n.  16. Levaquin 500 mg p.o. daily for five days.  17. Tramadol 50 mg q. 6 p.r.n.   REFERRALS: Physical therapy.   DIET: Regular soft diet.   BRIEF SUMMARY OF HOSPITAL COURSE: Kelly Cooley is a 81 year old patient with mental retardation, bipolar disorder, and history of polydipsia who presents with confusion, multiple falls, with a bruise over her left eyebrow. She was admitted with:  1. Hyponatremia: The patient's sodium level was 127. She has history of polydipsia; however, it was difficult to determine whether the symptoms of polydipsia were contributing to hyponatremia or not. The patient was started on some IV fluids. However,  she started wheezing some; hence, IV fluids were discontinued. She did receive some Lasix and diuresed well with her serum sodium reaching 132. The patient's. The patient appears euvolemic and currently is eating and drinking well.  2. Ambulatory dysfunction with falls: PT consulted. Recommend continued PT at rehab place which has been ordered.  3. Microcytic anemia without active bleeding: The patient has history of diverticulosis. No evidence of active bleeding was noted. Hemoglobin was 9.2. 4. Asymptomatic urinary tract infection with upper respiratory infection and congestion: Levaquin was started. She will complete a 6 to 7-day course of antibiotic treatment.  5. Hypertension with history of atrial fibrillation: The patient currently is in normal sinus rhythm. She was continued on Lopressor.  6. History of bipolar disorder with mental retardation and anxiety: Her  p.r.n. Ativan and Zyprexa were continued. 7. Clinically the patient appears to be at baseline. The patient will be discharged back to [facility].   TIME SPENT: 40 minutes.   ____________________________ Wylie HailSona A. Allena KatzPatel, MD sap:bjt D: 10/25/2011 12:45:36 ET T: 10/25/2011 12:57:58 ET JOB#: [id number]  cc: Alden ServerErnest B. Maryellen PileEason, MD Willow OraSONA A Lolamae Voisin MD ELECTRONICALLY SIGNED 10/28/2011 16:05

## 2014-12-08 NOTE — H&P (Signed)
PATIENT NAME:  Kelly Cooley, Kelly Cooley MR#:  [id number] DATE OF BIRTH:  June 12, 1933  DATE OF ADMISSION:  10/23/2011  REFERRING PHYSICIAN: ER physician Dr. Clemens Catholicagsdale PRIMARY CARE PHYSICIAN: Dr. Maryellen PileEason  CHIEF COMPLAINT: Confusion, combativeness, fall.   HISTORY OF PRESENT ILLNESS: Patient is a 81 year old female with past medical history of mental retardation, bipolar disorder who is a ward of the [place] and resident of [facility]. Patient was sent to the ED after being found on the floor of the restroom with a laceration above the region of her left eyebrow. She is complaining of headache and was confused. As per the ED physician patient has been having multiple falls, has been confused and combative. Currently patient is oriented to self and place and reports that she fell in the bathroom and she denies having passed out, however, additional useful information could not be obtained from the patient since she is a very poor historian.   ALLERGIES: Depakote, ACE inhibitor, Monopril.   PAST MEDICAL HISTORY: 1. Mental retardation. Patient is a ward [place].  2. Bipolar disorder.  3. History of polydipsia. 4. Hypertension. 5. Osteoporosis. 6. Gastroesophageal reflux disease. 7. Questionable history of seizure disorder. 8. History of atrial fibrillation in 2002.  9. History of Depakote toxicity with catatonia. 10. Osteoarthritis.  11. Urinary retention. 12. Anxiety. 13. History of sleep apnea. 14. History of GI bleed due to diverticulosis.    PAST SURGICAL HISTORY: As per old records patient has had:  1. Tonsillectomy, adenoidectomy. 2. Knee and foot surgery.  CURRENT MEDICATIONS: 1. Alendronate 70 mg q. weekly.  2. Aspirin 81 mg daily.  3. Vitamin D 50,000 international units q. weekly.  4. Flonase two sprays each nostril daily.  5. Meloxicam 50 mg daily.  6. Toprol-XL 25 mg b.i.d.  7. MiraLax 17 mg daily.  8. Namenda 10 mg b.i.d.  9. Zyprexa 5 mg at bedtime.  10. Omeprazole  20 mg b.i.d.  11. Oyster calcium 500 mg b.i.d.  12. Robitussin 10 mL q.6 hours p.r.n.  13. Chloraseptic lozenges p.r.n. sore throat.  14. Ativan 0.5 mg daily p.r.n.   SOCIAL HISTORY: Patient is a resident of [facility]. She is a ward of [place]. There is no history of smoking, alcohol or drug abuse.    FAMILY HISTORY: Patient is unable to provide me with a family history.    REVIEW OF SYSTEMS: CONSTITUTIONAL: Patient denies any fatigue or fever. She reports that she is hurting all over. EYES: Denies any blurred or double vision. ENT: Denies any tinnitus, ear pain. RESPIRATORY: Has a cough. Denies any history of chronic obstructive pulmonary disease. CARDIOVASCULAR: Denies any chest pain, palpitations. GASTROINTESTINAL: Denies any nausea, vomiting, diarrhea, abdominal pain. GENITOURINARY: Denies dysuria, hematuria. ENDOCRINE: Denies any polyuria, nocturia. HEME/LYMPH: Denies any anemia, easy bruisability. INTEGUMENT: Denies any acne, rash. MUSCULOSKELETAL: Reports arthritis and pain all over her body. NEUROLOGICAL: Denies any numbness, weakness. PSYCH: Has history of bipolar disorder and anxiety.   PHYSICAL EXAMINATION:  VITAL SIGNS: Temperature 97.8, heart rate 71, respiratory rate 20, blood pressure 161/77, pulse 99% on room air.   GENERAL: Patient is an elderly Caucasian female sitting in bed.   HEAD: Atraumatic, normocephalic.   EYES: There is pallor. No icterus or cyanosis. Patient has a laceration on her left eyebrow which has been sealed with Dermabond.    ENT: Dry mucous membranes. Patient is edentulous. No oropharyngeal erythema or thrush.   NECK: Supple. No masses. No JVD. No thyromegaly or lymphadenopathy.  CHEST WALL: No tenderness to palpation. Not using accessory muscles of respiration. No intercostal muscle retractions.   LUNGS: Bilaterally clear to auscultation. No wheezing, rales, or rhonchi.   CARDIOVASCULAR: S1, S2 regular. No murmur, rales or gallop.    ABDOMEN: Soft, nontender, nondistended. No guarding. No rigidity. No organomegaly. Normal bowel sounds.   SKIN: Patient has ecchymosis on her extremities. No other rashes or lesions.   PSYCH: Normal mood and affect.   MUSCULOSKELETAL: No cyanosis or clubbing.   NEUROLOGICAL: Patient was awake and oriented to self and place. Nonfocal neurological exam. Cranial nerves grossly intact.   LABORATORY, DIAGNOSTIC AND RADIOLOGICAL DATA: CT of the cervical spine shows no evidence of acute osseous abnormalities, multilevel degenerative joint disease. White count 6.6, hemoglobin 9.2, hematocrit 28.8, glucose 95, BUN 21, creatinine 0.94, sodium 127, potassium 4.2, chloride 82, rest of complete metabolic panel is normal. CK, troponin negative. Urinalysis shows hematuria, 2+ bacteria. Chest x-ray shows interstitial infiltrate. Thoracic spine x-ray shows chronic deformity T5. Lumbar spine x-ray shows chronic deformity L1. CAT scan of the head showed no acute abnormalities.    ASSESSMENT AND PLAN: 81 year old female with past medical history of mental retardation, bipolar disorder and polydipsia presents with confusion, multiple falls and laceration on her left eyebrow.  1. Hyponatremia. Patient's sodium level is 127. This could be contributing to her confusion and fall. There are no previous labs available for comparison. Patient has history of polydipsia as per review of old medical records. Will hydrate the patient with IV fluids. Check urine osmolality and random urine sodium. Will recheck a sodium level in a.m. Patient's serum osmolality is normal.  2. Ambulatory dysfunction/falls. Will get a PT consult.  3. Microcytic anemia. Patient's hemoglobin is 9.2. She has a history of GI bleed secondary to diverticulosis. Will check a stool guaiac, iron studies and serum ferritin level.  4. Hematuria with 2+ bacteria in the urine. Will get a urine culture.  5. Interstitial infiltrate. Patient sounds congested and  has a cough. Clinically she does not appear to have wet crepts. Will obtain blood cultures and empirically treat with antibiotics which will also cover her for any urinary tract infection.  6. Chronic compression deformity T5 and L1. Patient has a known history of osteoporosis. Will continue patient's calcium and vitamin D. Will also provide with p.r.n. analgesic medications to help control her pain. 7. Hypertension with history of atrial fibrillation in 2002. Patient is currently in normal sinus rhythm. Will continue patient's Lopressor. Her blood pressure was elevated initially likely due to acute stress.  8. History of gastroesophageal reflux disease. Will continue patient's PPI.  9. History of bipolar disorder, mental retardation, anxiety. Will continue p.r.n. Ativan and Zyprexa.  10. Will place on GI and deep vein thrombosis prophylaxis.   Reviewed all medical records, discussed with the ED physician, discussed with the patient the plan of care and management.   TIME SPENT: 75 minutes.   ____________________________ Darrick Meigs, MD sp:cms D: 10/23/2011 13:27:32 ET T: 10/23/2011 14:07:42 ET JOB#: [id number] cc: Darrick Meigs, MD, <Dictator> Serita Sheller. Maryellen Pile, MD Darrick Meigs MD ELECTRONICALLY SIGNED 10/23/2011 17:40

## 2016-05-19 DIAGNOSIS — I509 Heart failure, unspecified: Secondary | ICD-10-CM | POA: Diagnosis not present

## 2016-05-19 DIAGNOSIS — I739 Peripheral vascular disease, unspecified: Secondary | ICD-10-CM | POA: Diagnosis not present

## 2016-05-19 DIAGNOSIS — Z515 Encounter for palliative care: Secondary | ICD-10-CM | POA: Diagnosis not present

## 2016-05-19 DIAGNOSIS — F0390 Unspecified dementia without behavioral disturbance: Secondary | ICD-10-CM | POA: Diagnosis not present

## 2016-05-19 DIAGNOSIS — I129 Hypertensive chronic kidney disease with stage 1 through stage 4 chronic kidney disease, or unspecified chronic kidney disease: Secondary | ICD-10-CM | POA: Diagnosis not present

## 2016-05-19 DIAGNOSIS — M810 Age-related osteoporosis without current pathological fracture: Secondary | ICD-10-CM | POA: Diagnosis not present

## 2016-05-19 DIAGNOSIS — I482 Chronic atrial fibrillation: Secondary | ICD-10-CM | POA: Diagnosis not present

## 2016-05-19 DIAGNOSIS — N189 Chronic kidney disease, unspecified: Secondary | ICD-10-CM | POA: Diagnosis not present

## 2016-05-19 DIAGNOSIS — M1990 Unspecified osteoarthritis, unspecified site: Secondary | ICD-10-CM | POA: Diagnosis not present

## 2016-05-19 DIAGNOSIS — Z66 Do not resuscitate: Secondary | ICD-10-CM | POA: Diagnosis not present

## 2017-01-14 ENCOUNTER — Emergency Department
Admission: EM | Admit: 2017-01-14 | Discharge: 2017-01-14 | Disposition: A | Discharge status: Home / Self Care | Payer: Medicare Other | Attending: Emergency Medicine | Admitting: Emergency Medicine

## 2017-01-14 ENCOUNTER — Emergency Department: Payer: Medicare Other

## 2017-01-14 ENCOUNTER — Encounter: Payer: Self-pay | Admitting: Emergency Medicine

## 2017-01-14 DIAGNOSIS — Y929 Unspecified place or not applicable: Secondary | ICD-10-CM | POA: Diagnosis not present

## 2017-01-14 DIAGNOSIS — Y939 Activity, unspecified: Secondary | ICD-10-CM | POA: Insufficient documentation

## 2017-01-14 DIAGNOSIS — S0992XA Unspecified injury of nose, initial encounter: Secondary | ICD-10-CM | POA: Diagnosis present

## 2017-01-14 DIAGNOSIS — Y999 Unspecified external cause status: Secondary | ICD-10-CM | POA: Diagnosis not present

## 2017-01-14 DIAGNOSIS — I509 Heart failure, unspecified: Secondary | ICD-10-CM | POA: Diagnosis not present

## 2017-01-14 DIAGNOSIS — S022XXB Fracture of nasal bones, initial encounter for open fracture: Secondary | ICD-10-CM

## 2017-01-14 DIAGNOSIS — S022XXA Fracture of nasal bones, initial encounter for closed fracture: Secondary | ICD-10-CM | POA: Insufficient documentation

## 2017-01-14 DIAGNOSIS — I110 Hypertensive heart disease with heart failure: Secondary | ICD-10-CM | POA: Diagnosis not present

## 2017-01-14 HISTORY — DX: Major depressive disorder, single episode, unspecified: F32.9

## 2017-01-14 HISTORY — DX: Peripheral vascular disease, unspecified: I73.9

## 2017-01-14 HISTORY — DX: Unspecified dementia, unspecified severity, without behavioral disturbance, psychotic disturbance, mood disturbance, and anxiety: F03.90

## 2017-01-14 HISTORY — DX: Gastro-esophageal reflux disease without esophagitis: K21.9

## 2017-01-14 HISTORY — DX: Barrett's esophagus without dysplasia: K22.70

## 2017-01-14 HISTORY — DX: Depression, unspecified: F32.A

## 2017-01-14 HISTORY — DX: Unspecified atrial fibrillation: I48.91

## 2017-01-14 HISTORY — DX: Essential (primary) hypertension: I10

## 2017-01-14 HISTORY — DX: Bipolar disorder, unspecified: F31.9

## 2017-01-14 HISTORY — DX: Cerebral infarction, unspecified: I63.9

## 2017-01-14 HISTORY — DX: Heart failure, unspecified: I50.9

## 2017-01-14 MED ORDER — CEPHALEXIN 500 MG PO CAPS: 500.0000 mg | ORAL_CAPSULE | Freq: Two times a day (BID) | ORAL | 0 refills | Status: AC

## 2017-01-14 MED ORDER — ACETAMINOPHEN 160 MG/5ML PO SOLN: 650.0000 mg | Freq: Once | ORAL | Status: DC

## 2017-01-14 NOTE — ED Notes (Signed)
Pt with lac bridge of nose, swollen, bruised. Airway intact. Not bleeding at this time. Pt seems to be at baseline.
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
## 2017-01-14 NOTE — ED Notes (Signed)
[facility]

## 2017-01-14 NOTE — ED Notes (Signed)
Pt discharged awaiting ride to [facility]. Paperwork will be sent with her.
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
## 2017-01-14 NOTE — ED Notes (Signed)
Report called to [facility]  

## 2017-01-24 ENCOUNTER — Emergency Department: Payer: Medicare Other

## 2017-01-24 ENCOUNTER — Emergency Department
Admission: EM | Admit: 2017-01-24 | Discharge: 2017-01-25 | Disposition: A | Discharge status: Skilled Nursing Facility | Payer: Medicare Other | Attending: Emergency Medicine | Admitting: Emergency Medicine

## 2017-01-24 ENCOUNTER — Encounter: Payer: Self-pay | Admitting: *Deleted

## 2017-01-24 DIAGNOSIS — W19XXXA Unspecified fall, initial encounter: Secondary | ICD-10-CM

## 2017-01-24 DIAGNOSIS — Y9389 Activity, other specified: Secondary | ICD-10-CM | POA: Diagnosis not present

## 2017-01-24 DIAGNOSIS — W06XXXA Fall from bed, initial encounter: Secondary | ICD-10-CM | POA: Insufficient documentation

## 2017-01-24 DIAGNOSIS — Y998 Other external cause status: Secondary | ICD-10-CM | POA: Diagnosis not present

## 2017-01-24 DIAGNOSIS — Y92122 Bedroom in nursing home as the place of occurrence of the external cause: Secondary | ICD-10-CM | POA: Insufficient documentation

## 2017-01-24 DIAGNOSIS — I509 Heart failure, unspecified: Secondary | ICD-10-CM | POA: Diagnosis not present

## 2017-01-24 DIAGNOSIS — S0083XA Contusion of other part of head, initial encounter: Secondary | ICD-10-CM | POA: Diagnosis not present

## 2017-01-24 DIAGNOSIS — F0390 Unspecified dementia without behavioral disturbance: Secondary | ICD-10-CM | POA: Diagnosis not present

## 2017-01-24 DIAGNOSIS — I110 Hypertensive heart disease with heart failure: Secondary | ICD-10-CM | POA: Insufficient documentation

## 2017-01-24 DIAGNOSIS — S01111A Laceration without foreign body of right eyelid and periocular area, initial encounter: Secondary | ICD-10-CM | POA: Diagnosis not present

## 2017-01-24 DIAGNOSIS — S0181XA Laceration without foreign body of other part of head, initial encounter: Secondary | ICD-10-CM

## 2017-01-24 DIAGNOSIS — S01411A Laceration without foreign body of right cheek and temporomandibular area, initial encounter: Secondary | ICD-10-CM | POA: Diagnosis present

## 2017-01-24 DIAGNOSIS — S022XXD Fracture of nasal bones, subsequent encounter for fracture with routine healing: Secondary | ICD-10-CM | POA: Diagnosis not present

## 2017-01-24 MED ORDER — HYDROCODONE-ACETAMINOPHEN 5-325 MG PO TABS
2.0000 | ORAL_TABLET | Freq: Once | ORAL | Status: AC
  Administered 2017-01-25: 2 via ORAL
  Filled 2017-01-24: qty 2
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
## 2017-01-25 DIAGNOSIS — S0083XA Contusion of other part of head, initial encounter: Secondary | ICD-10-CM | POA: Diagnosis not present

## 2017-01-25 MED ORDER — LIDOCAINE HCL (PF) 1 % IJ SOLN
INTRAMUSCULAR | Status: AC
  Administered 2017-01-25: via SUBCUTANEOUS
  Filled 2017-01-25: qty 10

## 2017-01-25 NOTE — Discharge Instructions (Signed)
Fortunately, there was no indication of any new fractures or intracranial bleeding on Kelly Cooley's scans.  Her nasal fractures are still present, of course.  We sutured her new forehead/eyebrow laceration and the suture should be removed in about a week (we also applied some skin adhesive to help keep the wound closed).  The wound on her right cheek is a skin tear, and it was repaired with skin glue and Steri-Strips.  Please try to keep them clean and dry and allow them to fall off on their own - do not remove the Steri-Strips early.  Follow up with her regular doctor at the next available opportunity.

## 2017-01-25 NOTE — ED Notes (Signed)
Pt tolerated container of applesauce

## 2017-01-25 NOTE — ED Notes (Signed)
Suturing and wound care completed by Dr York CeriseForbach; pt tolerated well

## 2017-01-25 NOTE — ED Notes (Signed)
Pt uprite on stretcher in exam room with no distress noted; pt restless; small amount bleeding noted from lac above right eye and to right cheek with purplish bruising to forehead and cheeks bilat; wounds clensed with NS; pt alert to self only; st "my head hurts" and grimaces with clensing of wounds

## 2018-09-19 ENCOUNTER — Non-Acute Institutional Stay: Payer: Commercial Managed Care - HMO | Admitting: Nurse Practitioner

## 2018-09-19 ENCOUNTER — Encounter: Payer: Self-pay | Admitting: Nurse Practitioner

## 2018-09-19 VITALS — BP 132/68 | HR 51 | Temp 98.0°F | Resp 18 | Ht 60.0 in | Wt 146.0 lb

## 2018-09-19 DIAGNOSIS — R413 Other amnesia: Secondary | ICD-10-CM | POA: Insufficient documentation

## 2018-09-19 DIAGNOSIS — R630 Anorexia: Secondary | ICD-10-CM | POA: Insufficient documentation

## 2018-09-19 DIAGNOSIS — Z515 Encounter for palliative care: Secondary | ICD-10-CM

## 2018-09-19 NOTE — Progress Notes (Signed)
Community Palliative Care Telephone: [phone] Fax: [phone]  PATIENT NAME: Kelly Cooley DOB: 1933-05-27 MRN: [id number]  PRIMARY CARE PROVIDER:   Dr Jamas Lav PROVIDER:  Dr [facility] RESPONSIBLE PARTY:   Cyndi Bender DSS Guardian [phone]  RECOMMENDATIONS and PLAN:  1. Palliative care encounter Z51.5; Palliative medicine team will continue to support patient, patient's family, and medical team. Visit consisted of counseling and education dealing with the complex and emotionally intense issues of symptom management and palliative care in the setting of serious and potentially life-threatening illness  Medical goals of care include DNR, do not intubate, do not hospitalize, no blood transfusion, no feeding tube, no surgical intervention but wishes are for antibiotics, diagnostic testing, IV hydration, lab testing.  2. Memory loss R41.3 secondary to dementia. Continue with supportive measures as chronic disease remains Progressive.  3. Anorexia R63.0 but appetite remaining declined. Continue to encourage supplements and comfort feedings.   ASSESSMENT:  I visited and observed Kelly Cooley. She is well known to me as she is been a palliative care patient for several years. She does make eye contact with verbal cues. She does attempt to interact and say words. "My name is Kelly Cooley". She attempts to participate in conversation repeating simple words. She does open her Bible as well as her stuffed animal she shares. She attempts to be very interactive though no meaningful discussion due to cognitive impairment. At palliative care visit she was attempting to feed herself and the utensils turn in her hand. Assisted with feeding Kelly Cooley lunch and noted that would be beneficial for OT consult for possibly weighted utensils. This may help with better control of the utensil. She did eat all of her lunch. She pointed to the necklace with beads that she was wearing,  very proud of them. Emotional support provided. She is under DSS Guardianship and DNR already in place with medical goals to focus on comfort. I have updated after nurse practitioner and will order OT for further evaluation for utensils and ideas to help Kelly Cooley with eating herself. She does continued appear stable in the sending of progression of chronic disease. She has had no recent exacerbations. Will continue to Monitor and follow with palliative care next month OT evaluation and recommendations if needed or sooner should she declined.  BMI 28.5 9 / 4 / 2018 weight 147.6 lbs 12 / 4 / 2019 weight 146.5 lbs 1 / 08/2018 weight 146.0 lbs   Palliative care 12 / 11 / 2018 Palliative care 10 / 14 / 2016 to 7 / 2 / 2018 Hospice 7 / 5 / 2018 to 12 / 6 / 2018  I spent 45 minutes providing this consultation,  from 1:15pm to 2:00pm. More than 50% of the time in this consultation was spent coordinating communication.   HISTORY OF PRESENT ILLNESS:  Kelly Cooley is a 85 y.o. year old female with multiple medical problems including Late onset CVA, left hemiplegia, peripheral vascular disease, congestive heart failure, atrial fibrillation, Alzheimer's dementia with psychosis, hypertension, dysphasia, history of GI bleed secondary to diverticulosis, Barrett's esophagus, anemia, chronic kidney disease, seizure disorder, osteoarthritis, osteoporosis, bipolar disorder, anxiety, history of shoulder dislocation, history of compression fracture L1 and T5, allergic rhinitis, intellectual disabilities. Kelly Cooley continues to reside at [facility]. She is a transfer to the wheelchair where she does set up during the day. She does attempted Propel with her feet small steps though usually  requires assistance from staff with mobility. She is total ADL dependents with incontinence bowel and bladder. She does feed herself. Appetite has been Fair. She is able to say  simple words like her name so no meaningful discussion. She does point and say the same words. Staff endorses she does like to be around other residents and attend activities. No recent wounds, Falls come infections, hospitalizations. Last primary provider Note 12 / 18 / 2019 for follow-up wound which had healed. She is followed by Psychiatry at the facility with last date of service  1 / 30 / 2020  4  vascular dementia, major NCD, intellectual disabilities, BPD, EPS with anxiety prescribed Zyprexa for BPD, Lamictal, Cymbalta for mood and anxiety. She was continuing to have crying episodes and hitting when she does get upset. Discontinued Zyprexa and initiated  Lamictal 25 mg at 8 AM, 2 p.m. and Lamictal 50 mg qhs. Podiatry does follow her at the facility. At present she is sitting in the wheelchair at the table in the dining area tempting to feed herself lunch. She appears to functionally debilitated but comfortable. No visitors present. Palliative Care was asked to help address goals of care.   CODE STATUS: DNR  PPS: 40% HOSPICE ELIGIBILITY/DIAGNOSIS: TBD  PAST MEDICAL HISTORY:  Past Medical History:  Diagnosis Date  . Atrial fibrillation (HCC)   . Barrett's esophagus   . Bipolar 1 disorder (HCC)   . CHF (congestive heart failure) (HCC)   . CVA (cerebral vascular accident) (HCC)   . Dementia (HCC)   . Depression   . GERD (gastroesophageal reflux disease)   . Hypertension   . PVD (peripheral vascular disease) (HCC)     SOCIAL HX:  Social History   Tobacco Use  . Smoking status: Never Smoker  . Smokeless tobacco: Never Used  Substance Use Topics  . Alcohol use: No    ALLERGIES:  Allergies  Allergen Reactions  . Ace Inhibitors   . Aspirin   . Depakene [Divalproex Sodium]   . Nsaids      PERTINENT MEDICATIONS:  Outpatient Encounter Medications as of 09/19/2018  Medication Sig  . cephALEXin (KEFLEX) 500 MG capsule Take 1 capsule (500 mg total) by mouth 2 (two) times daily.   No  facility-administered encounter medications on file as of 09/19/2018.     PHYSICAL EXAM:   General: frail appearing, debilitated, confused, female Cardiovascular: regular rate and rhythm Pulmonary: clear ant fields Abdomen: soft, nontender, + bowel sounds GU: no suprapubic tenderness Extremities: no edema, no joint deformities Skin: no rashes Neurological: Weakness but otherwise nonfocal/functional quadriplegic  Fynley Cooley Prince Rome, NP

## 2018-10-31 ENCOUNTER — Non-Acute Institutional Stay: Payer: Medicare Other | Admitting: Nurse Practitioner

## 2018-10-31 ENCOUNTER — Encounter: Payer: Self-pay | Admitting: Nurse Practitioner

## 2018-10-31 VITALS — BP 134/68 | HR 58 | Temp 98.3°F | Resp 18 | Ht 60.0 in | Wt 135.8 lb

## 2018-10-31 DIAGNOSIS — Z515 Encounter for palliative care: Secondary | ICD-10-CM

## 2018-10-31 DIAGNOSIS — R413 Other amnesia: Secondary | ICD-10-CM | POA: Insufficient documentation

## 2018-10-31 DIAGNOSIS — R630 Anorexia: Secondary | ICD-10-CM

## 2018-10-31 NOTE — Progress Notes (Signed)
Therapist, nutritional Palliative Care Consult Note Telephone: [phone]  Fax: [phone]  PATIENT NAME: Kelly Cooley DOB: [birth year] MRN: [id number]  PRIMARY CARE PROVIDER:   Dr Jamas Lav PROVIDER:  Dr [facility] RESPONSIBLE PARTY:   Cyndi Bender DSS Guardian [phone]  RECOMMENDATIONS and PLAN:  1. Palliative care encounter Z51.5; Palliative medicine team will continue to support patient, patient's family, and medical team. Visit consisted of counseling and education dealing with the complex and emotionally intense issues of symptom management and palliative care in the setting of serious and potentially life-threatening illness  Medical goals of care include DNR, do not intubate, do not hospitalize, no blood transfusion, no feeding tube, no surgical intervention but wishes are for antibiotics, diagnostic testing, IV hydration, lab testing.  2. Memory loss R41.3 secondary to dementia. Continue with supportive measures as chronic disease remains Progressive.  3. Anorexia R63.0 but appetite remaining declined. Continue to encourage supplements and comfort feedings.   ASSESSMENT:     I visited and observed Kelly Cooley. She did make eye contact mumbling interacting saying if you clear words intermittently. She did point to her stuffed animal which she was holding in her lap. She was populated with assessment and did make eye contact. No meaningful discussion due to cognitive impairment. Emotional support provided. Sat with Kelly Cooley for length of time and emotional support provided. At present time she does appear to remain stable in the setting of progression of chronic disease. Will continue to monitor and follow with next visit in 4 weeks if needed or sooner should she declined. I have updated nursing staff.  BMI 28.5 9 / 4 / 2018 weight 147.6 lbs 12 / 4 / 2019 weight 146.5 lbs 1 / 08/2018 weight 146.0 lbs 10/24/3028 weight 135.8  lbs   Palliative care 12 / 11 / 2018 Palliative care 10 / 14 / 2016 to 7 / 2 / 2018 Hospice 7 / 5 / 2018 to 12 / 6 / 2018   I spent 45 minutes providing this consultation,  from 11:45am to 12:15pm. More than 50% of the time in this consultation was spent coordinating communication.   HISTORY OF PRESENT ILLNESS:  Kelly Cooley is a 85 y.o. year old female with multiple medical problems including Late onset CVA, left hemiplegia, peripheral vascular disease, congestive heart failure, atrial fibrillation, Alzheimer's dementia with psychosis, hypertension, dysphasia, history of GI bleed secondary to diverticulosis, Barrett's esophagus, anemia, chronic kidney disease, seizure disorder, osteoarthritis, osteoporosis, bipolar disorder, anxiety, history of shoulder dislocation, history of compression fracture L1 and T5, allergic rhinitis, intellectual disabilities. Kelly Cooley continues to reside at skilled [facility]. She is a transfer to the wheelchair, ADL dependent with incontinence bowel and bladder. She is able to feed herself after tray setup and appetite has been Fair. Last primary provider note 2 / 6 / 2020 for follow-up COPD with no recent exacerbations. She is also followed by a psychiatrist last date of service 3/5 / 2020 for vascular dementia, major NCD, BPD, EPs and anxiety prescribed Lamictal for mood, Cymbalta for mood, anxiety with no behavioral concerns and no adjustment at this time. No recent hospitalizations, wounds, Falls, infections. She is cognitively impaired and does have difficulty verbalizing for needs. At present she's sitting in the wheelchair at the nurses station. She appears comfortable. Palliative Care was asked to help address goals of care.   CODE STATUS: DNR  PPS: 30% HOSPICE ELIGIBILITY/DIAGNOSIS: TBD  PAST MEDICAL HISTORY:  Past Medical History:  Diagnosis Date  . Atrial fibrillation (HCC)   . Barrett's esophagus   . Bipolar 1 disorder (HCC)   .  CHF (congestive heart failure) (HCC)   . CVA (cerebral vascular accident) (HCC)   . Dementia (HCC)   . Depression   . GERD (gastroesophageal reflux disease)   . Hypertension   . PVD (peripheral vascular disease) (HCC)     SOCIAL HX:  Social History   Tobacco Use  . Smoking status: Never Smoker  . Smokeless tobacco: Never Used  Substance Use Topics  . Alcohol use: No    ALLERGIES:  Allergies  Allergen Reactions  . Ace Inhibitors   . Aspirin   . Depakene [Divalproex Sodium]   . Nsaids      PERTINENT MEDICATIONS:  Outpatient Encounter Medications as of 10/31/2018  Medication Sig  . cephALEXin (KEFLEX) 500 MG capsule Take 1 capsule (500 mg total) by mouth 2 (two) times daily.   No facility-administered encounter medications on file as of 10/31/2018.     PHYSICAL EXAM:   General: NAD, frail appearing, thin, chronically ill confused female Cardiovascular: regular rate and rhythm Pulmonary: clear ant fields Abdomen: soft, nontender, + bowel sounds GU: no suprapubic tenderness Extremities: no edema, no joint deformities Skin: no rashes Neurological: Weakness but otherwise nonfocal/functional quadriplegic  Christin Prince Rome, NP

## 2018-11-01 ENCOUNTER — Other Ambulatory Visit: Payer: Self-pay

## 2018-11-23 ENCOUNTER — Encounter: Payer: Self-pay | Admitting: Nurse Practitioner

## 2018-11-23 ENCOUNTER — Non-Acute Institutional Stay: Payer: Medicare Other | Admitting: Nurse Practitioner

## 2018-11-23 ENCOUNTER — Other Ambulatory Visit: Payer: Self-pay

## 2018-11-23 VITALS — BP 100/70 | HR 88 | Resp 18 | Wt 140.5 lb

## 2018-11-23 DIAGNOSIS — R630 Anorexia: Secondary | ICD-10-CM | POA: Insufficient documentation

## 2018-11-23 DIAGNOSIS — R413 Other amnesia: Secondary | ICD-10-CM | POA: Insufficient documentation

## 2018-11-23 DIAGNOSIS — Z515 Encounter for palliative care: Secondary | ICD-10-CM

## 2018-11-23 NOTE — Progress Notes (Addendum)
[facility] Consult Note Telephone: [phone]  Fax: [phone]  PATIENT NAME: Kelly Cooley DOB: Aug 16, 1933 MRN: [id number]  PRIMARY CARE PROVIDER:   Dr Jamas LavSmith REFERRING PROVIDER:  [facility] RESPONSIBLE PARTY:  Cyndi BenderLaSha Hickman DSS Guardian [phone]  RECOMMENDATIONS and PLAN:  Recommendations  Roxanol 5 million mg Q 2 hours as needed for respirations greater than 26, shortness of breath  Atropine drops give to drop ski 4 hours as needed for secretions  Ativan 0.5 mg 4 hours as needed for agitation or anxiety  1.Palliative care encounter Z51.5; Palliative medicine team will continue to support patient, patient's family, and medical team. Visit consisted of counseling and education dealing with the complex and emotionally intense issues of symptom management and palliative care in the setting of serious and potentially life-threatening illness  Medical goals of care include DNR, do not intubate, do not hospitalize, no blood transfusion, no feeding tube, no surgical intervention but wishes are for antibiotics, diagnostic testing, IV hydration, lab testing.  2.Memory loss R41.3 secondary to dementia. Continue with supportive measures as chronic disease remains Progressive.  3.Anorexia R63.0 but appetite remaining declined. Continue to encourage supplements and comfort feedings.  ASSESSMENT:     2 / 8 / 2019 weight 163 lbs 1 / 08/2018 weight 146.0 lbs 2 / 6 / 2020 weight 148.6 lbs 3 / 11 / 2020 weight 135.8 lbs 4 / 6 / 2020 weight 140.5 lbs  I visited and observed Kelly Cooley. She did not make eye contact with verbal cues as she is grimacing with her eyes open. She does Mumble some words but appears tearful. Breath sounds with poor air movement, St crackles and increase in secretions. She does have dysphasia and appears to be having some difficulty managing secretions currently. [facility] nurse practitioner  called and discussed with DSS guardian and both in agreement with medical goals to continue to focus on comfort and revisit hospice as she does appear eligible. Notified facility and hospice order sent  I spent 45 minutes providing this consultation,  from 12:00pm to 12:45pm. More than 50% of the time in this consultation was spent coordinating communication.   HISTORY OF PRESENT ILLNESS:  Kelly DykesMartha C Cooley is a 85 y.o. year old female with multiple medical problems including Late onset CVA, left hemiplegia, peripheral vascular disease, congestive heart failure, atrial fibrillation, Alzheimer's dementia with psychosis, hypertension, dysphasia, history of GI bleed secondary to diverticulosis, Barrett's esophagus, anemia, chronic kidney disease, seizure disorder, osteoarthritis, osteoporosis, bipolar disorder, anxiety, history of shoulder dislocation, history of compression fracture L1 and T5, allergic rhinitis, intellectual disabilities. I was [facility] to see Kelly [facility] as she has been very familiar to me initially in private practice, now palliative for over 10 years for new onset of decline. Kelly Cooley resides and skilled [facility]. She was requiring transfer to the wheelchair where she was able to sit up, those staff was requiring to move her as she was not able to propel the wheelchair. She is total Care ADL with incontinence of bowel and bladder. Over the last several weeks appetite has declined and ongoing weight loss. [facility] due to stability. She's had a slow decline since hospice discharge up until the last two days where she has had significant clinical changes. She is lethargic, lying in bed. She appears congested. She is reaching in the air, reaching in the air and appears  to be approaching end of life. No visitors present. Palliative Care was asked to help address goals of care.   CODE STATUS: DNR PPS:  30% HOSPICE ELIGIBILITY/DIAGNOSIS: TBD  PAST MEDICAL HISTORY:  Past Medical History:  Diagnosis Date   Atrial fibrillation (HCC)    Barrett's esophagus    Bipolar 1 disorder (HCC)    CHF (congestive heart failure) (HCC)    CVA (cerebral vascular accident) (HCC)    Dementia (HCC)    Depression    GERD (gastroesophageal reflux disease)    Hypertension    PVD (peripheral vascular disease) (HCC)     SOCIAL HX:  Social History   Tobacco Use   Smoking status: Never Smoker   Smokeless tobacco: Never Used  Substance Use Topics   Alcohol use: No    ALLERGIES:  Allergies  Allergen Reactions   Ace Inhibitors    Aspirin    Depakene [Divalproex Sodium]    Nsaids      PERTINENT MEDICATIONS:  Outpatient Encounter Medications as of 11/23/2018  Medication Sig   cephALEXin (KEFLEX) 500 MG capsule Take 1 capsule (500 mg total) by mouth 2 (two) times daily.   No facility-administered encounter medications on file as of 11/23/2018.     PHYSICAL EXAM:   General: chronically ill, female, lethargic, debilitated, appears progressing to end of life Cardiovascular: regular rate and rhythm Pulmonary: clear ant fields Abdomen: soft, nontender, + bowel sounds GU: no suprapubic tenderness Extremities: +BLE edema, no joint deformities Skin: no rashes Neurological: Weakness but otherwise nonfocal/functional quadriplegic  Pariss Hommes Prince Rome, NP

## 2018-12-15 DEATH — deceased
